# Patient Record
Sex: Female | Born: 1955 | Race: White | Hispanic: No | Marital: Married | Smoking: Never smoker
Health system: Southern US, Community
[De-identification: ages and names within clinical notes are randomized; demographics above are authoritative.]

## PROBLEM LIST (undated history)

## (undated) DIAGNOSIS — I10 Essential (primary) hypertension: Secondary | ICD-10-CM

## (undated) DIAGNOSIS — M810 Age-related osteoporosis without current pathological fracture: Secondary | ICD-10-CM

## (undated) DIAGNOSIS — J309 Allergic rhinitis, unspecified: Secondary | ICD-10-CM

## (undated) DIAGNOSIS — J45909 Unspecified asthma, uncomplicated: Secondary | ICD-10-CM

## (undated) HISTORY — DX: Allergic rhinitis, unspecified: J30.9

## (undated) HISTORY — DX: Age-related osteoporosis without current pathological fracture: M81.0

## (undated) HISTORY — DX: Essential (primary) hypertension: I10

## (undated) HISTORY — PX: UTERINE FIBROID SURGERY: SHX826

## (undated) HISTORY — DX: Unspecified asthma, uncomplicated: J45.909

## (undated) HISTORY — PX: APPENDECTOMY: SHX54

## (undated) HISTORY — PX: TUBAL LIGATION: SHX77

---

## 2003-01-28 ENCOUNTER — Ambulatory Visit (HOSPITAL_BASED_OUTPATIENT_CLINIC_OR_DEPARTMENT_OTHER): Admission: RE | Admit: 2003-01-28 | Discharge: 2003-01-28 | Payer: Self-pay | Admitting: Orthopedic Surgery

## 2005-03-31 ENCOUNTER — Encounter: Admission: RE | Admit: 2005-03-31 | Discharge: 2005-03-31 | Payer: Self-pay | Admitting: Family Medicine

## 2006-06-12 IMAGING — US US ABDOMEN COMPLETE
1 series · 14 of 25 positions shown · non-contrast
Comparison: None.

CLINICAL DATA: Elevated liver function tests. 
 ABDOMEN ULTRASOUND:
TECHNIQUE: Complete abdominal ultrasound examination was performed including evaluation of the liver, gallbladder, bile ducts, pancreas, kidneys, spleen, IVC, and abdominal aorta.

[Series 1: unknown · 14 of 77 slices shown]
[im 1/77]
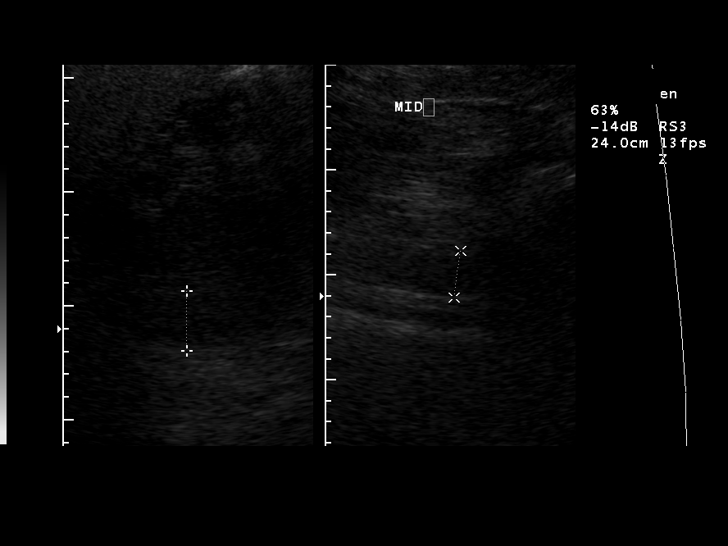
[im 7/77]
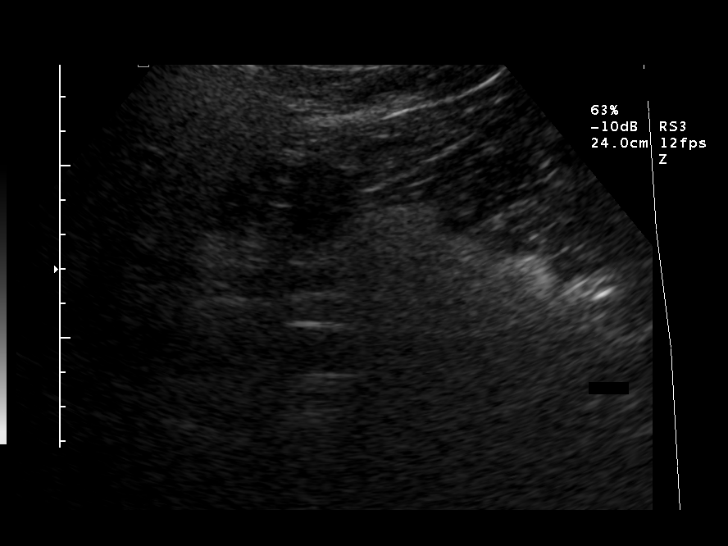
[im 13/77]
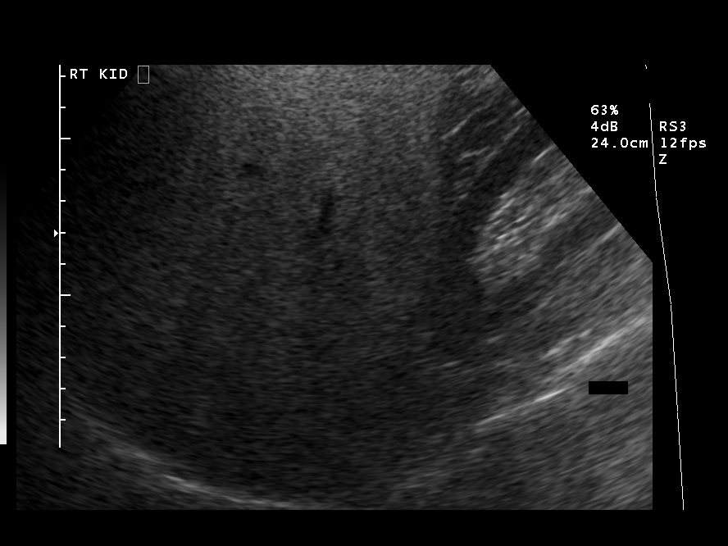
[im 20/77]
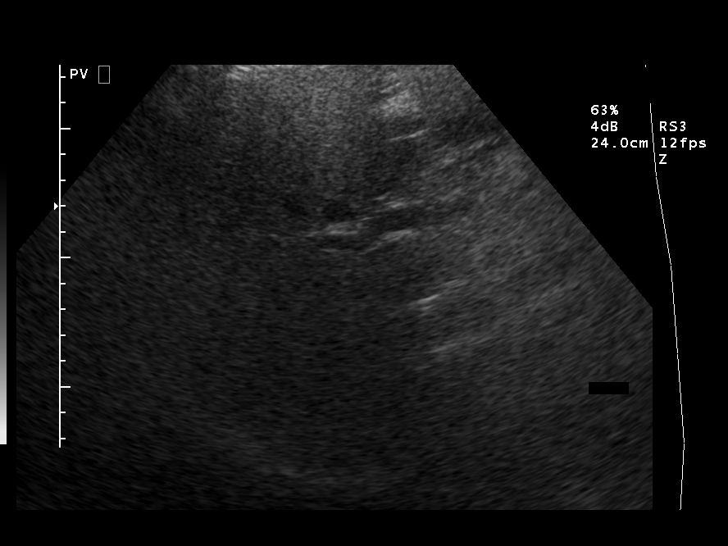
[im 26/77]
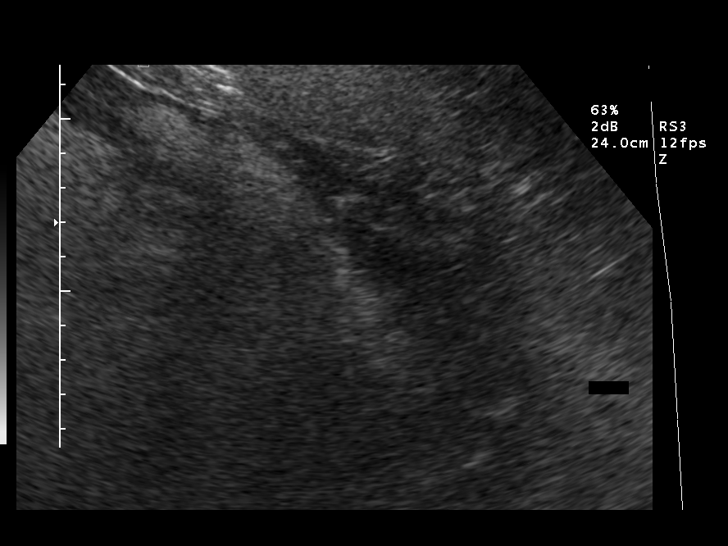
[im 29/77]
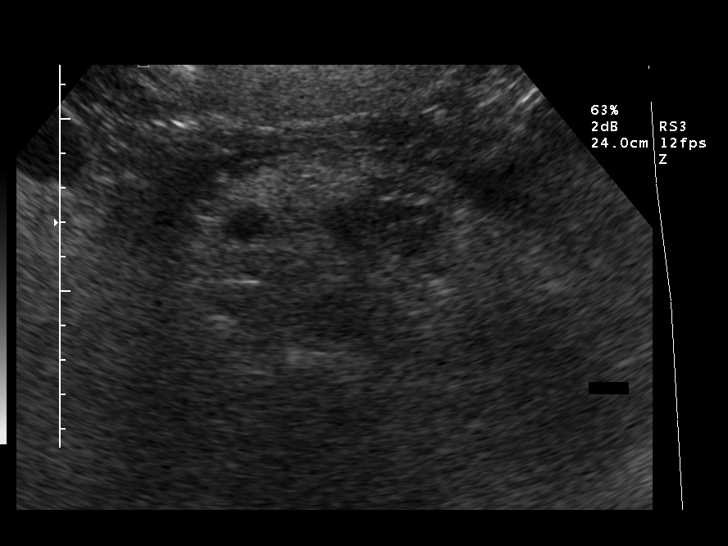
[im 35/77]
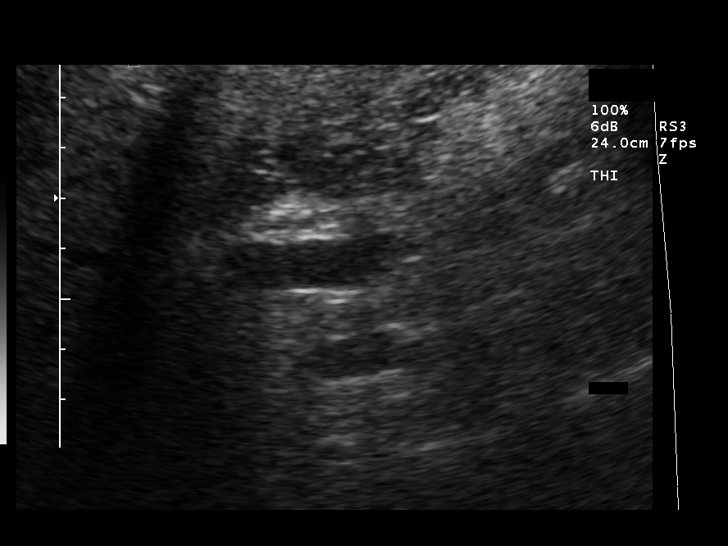
[im 42/77]
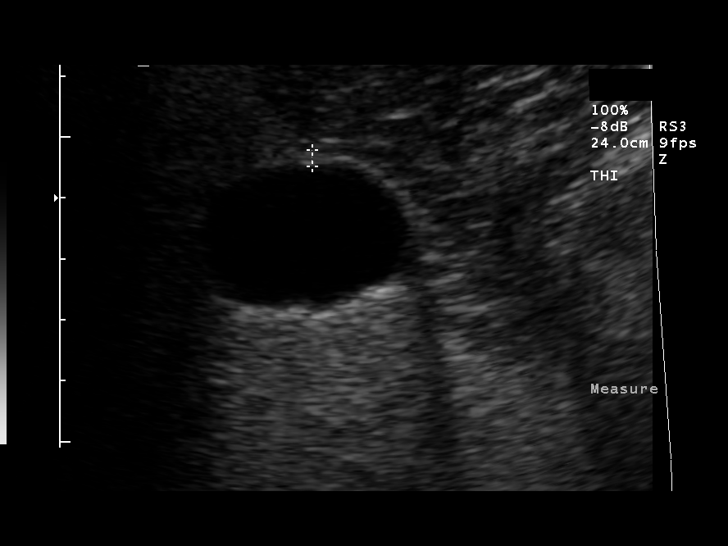
[im 48/77]
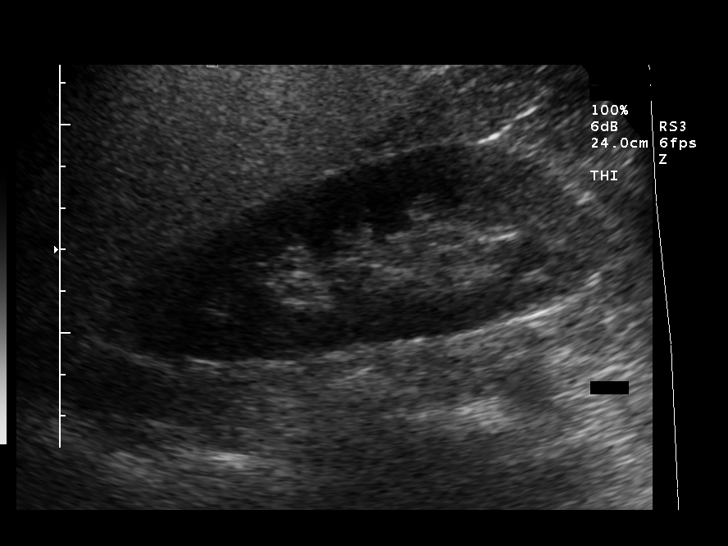
[im 51/77]
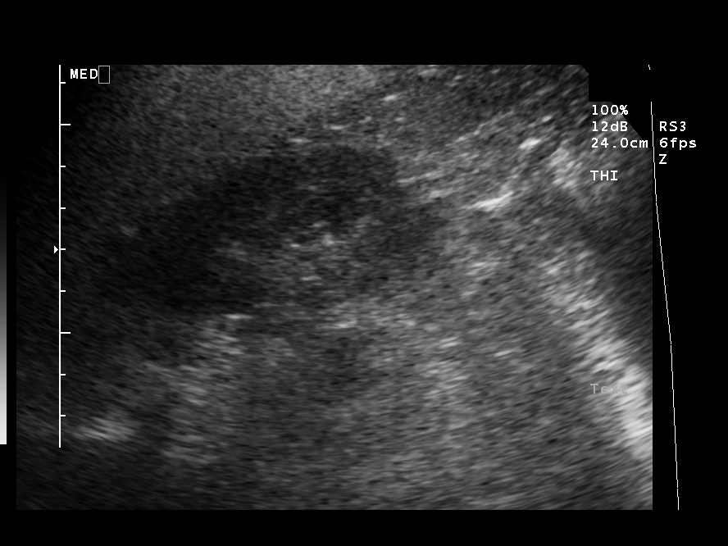
[im 58/77]
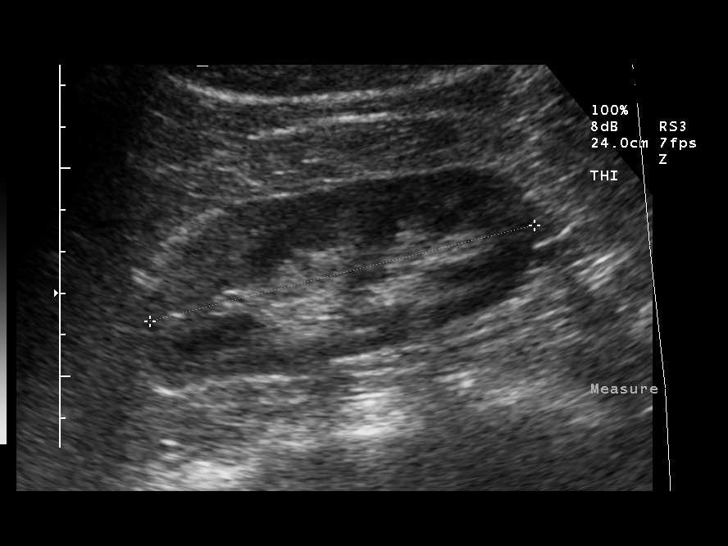
[im 64/77]
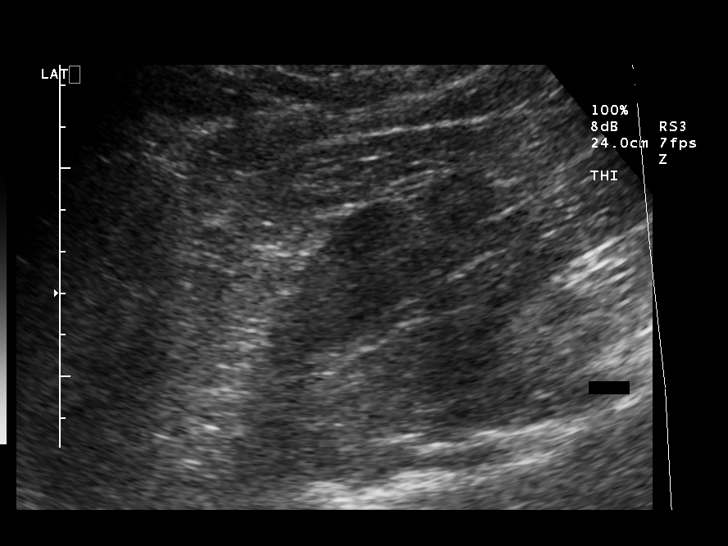
[im 70/77]
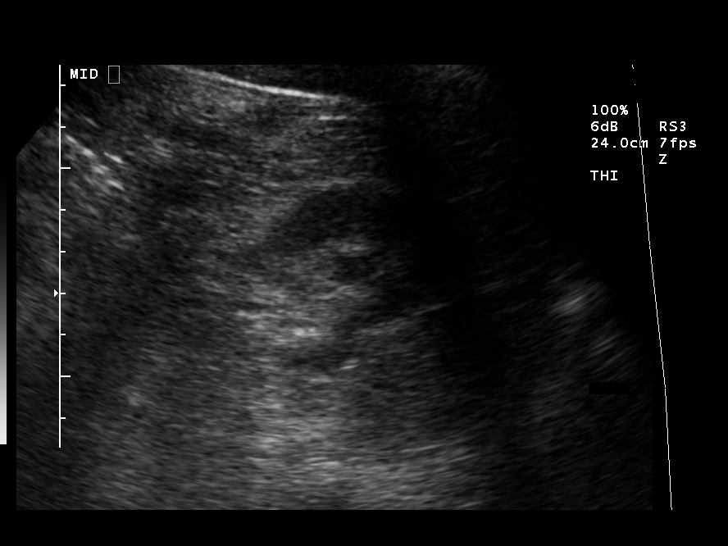
[im 77/77]
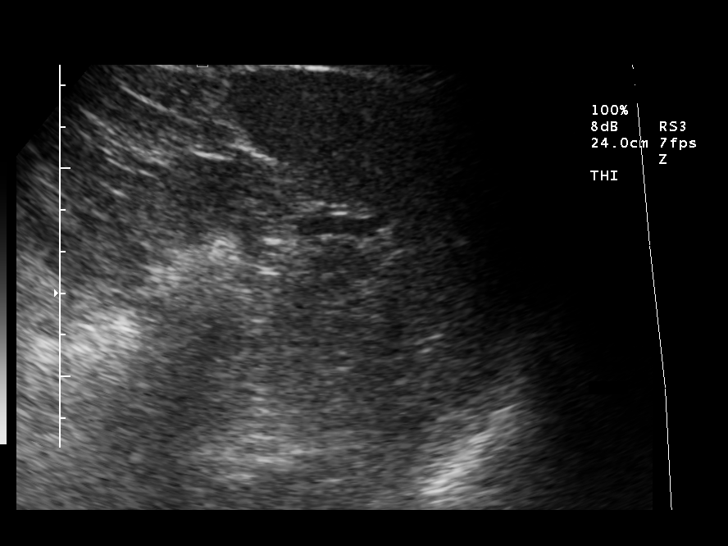

[14 of 25 positions shown; findings below may reference images not displayed]

FINDINGS: Study is limited by intestinal bowel gas.  Portions of the inferior vena cava and pancreas are obscured by bowel gas.  The liver demonstrates diffusely increased echogenicity most compatible with fatty infiltration.  No focal hepatic abnormalities are seen.  There is no biliary dilatation.  The gallbladder appears normal without gallstones or wall thickening.  Views of the spleen, pancreas, IVC, and aorta are normal.  The right kidney measures 11.6 cm in length and the left kidney 10.1 cm.  No focal renal abnormalities are seen.  Left renal cortex is slightly lobular.
IMPRESSION: 1.  Probable fatty infiltration of the liver.  No biliary dilatation.  
 2.  No acute abdominal findings are seen.  Portions of the pancreas are obscured by bowel gas.

## 2007-01-21 ENCOUNTER — Encounter: Admission: RE | Admit: 2007-01-21 | Discharge: 2007-01-21 | Payer: Self-pay | Admitting: Internal Medicine

## 2008-04-03 IMAGING — US US ABDOMEN COMPLETE
1 series · 14 of 25 positions shown · non-contrast
Comparison: 03/31/05.

CLINICAL DATA: Elevated LFTs.  
 ABDOMINAL ULTRASOUND:
TECHNIQUE: Complete abdominal ultrasound examination was performed including evaluation of the liver, gallbladder, bile ducts, pancreas, kidneys, spleen, IVC, and abdominal aorta.

[Series 1: us abdomen complete · 0.31mm/px · 14 of 77 slices shown]
[im 1/77]
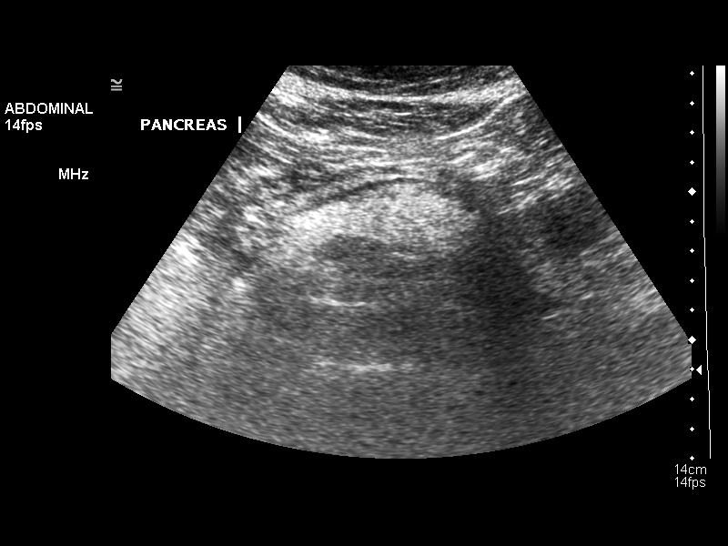
[im 7/77]
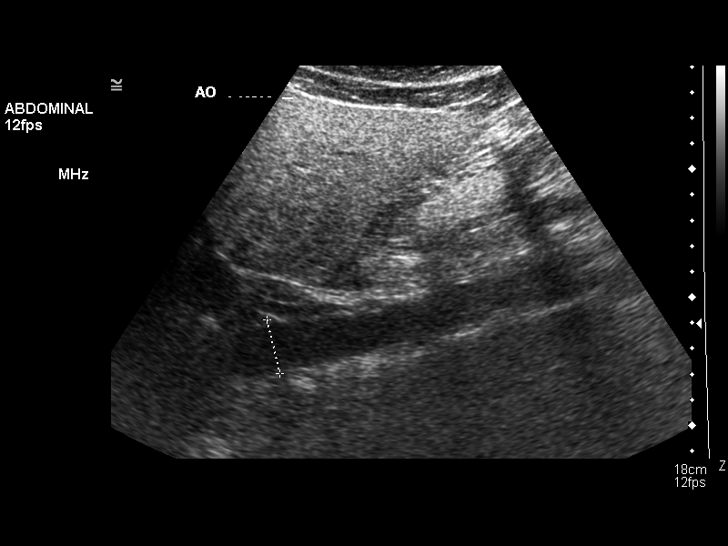
[im 13/77]
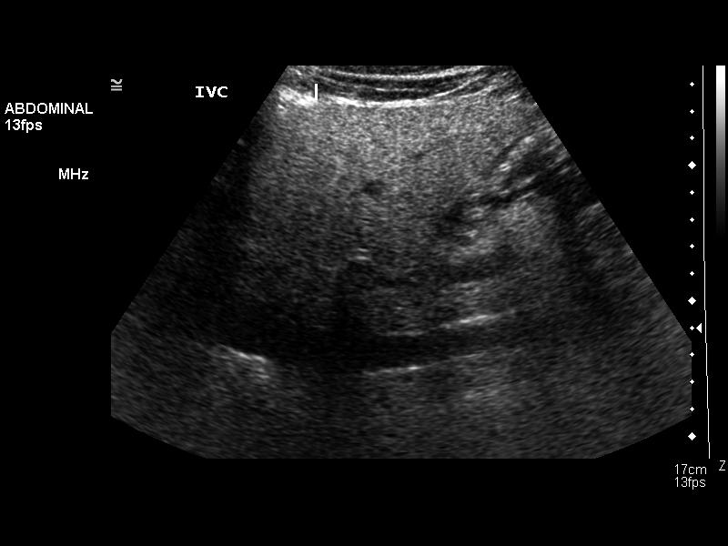
[im 20/77]
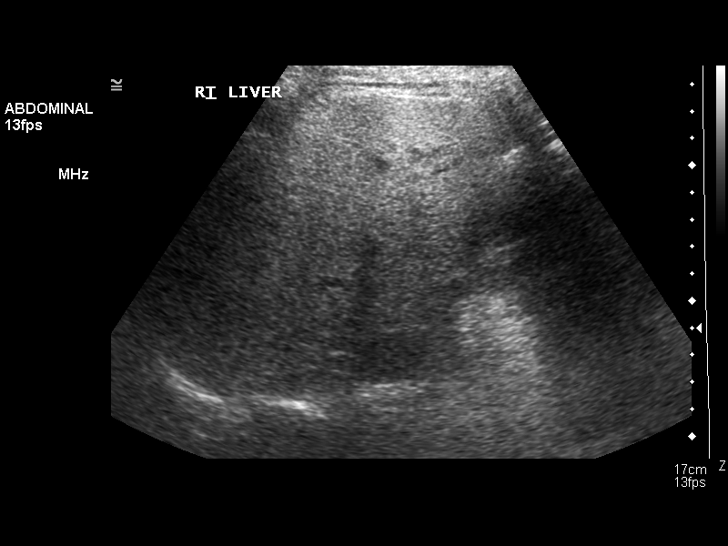
[im 26/77]
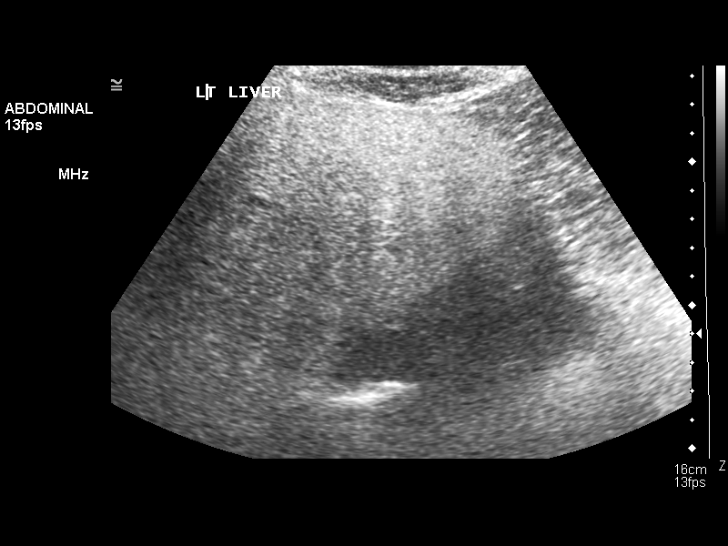
[im 29/77]
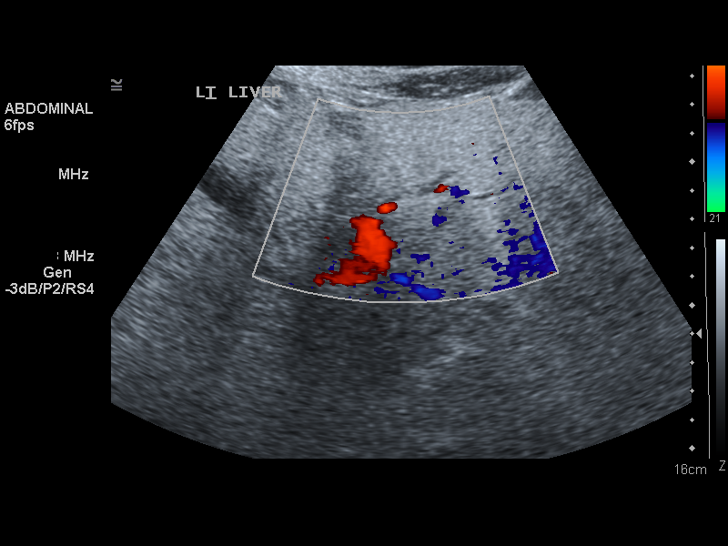
[im 35/77]
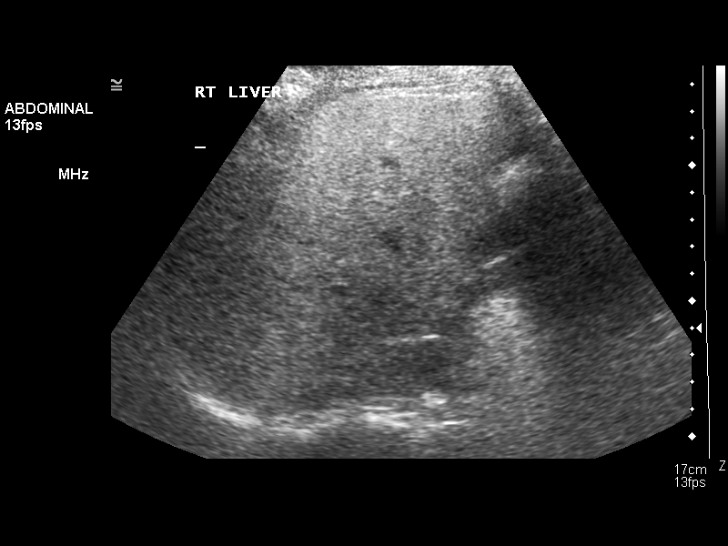
[im 42/77]
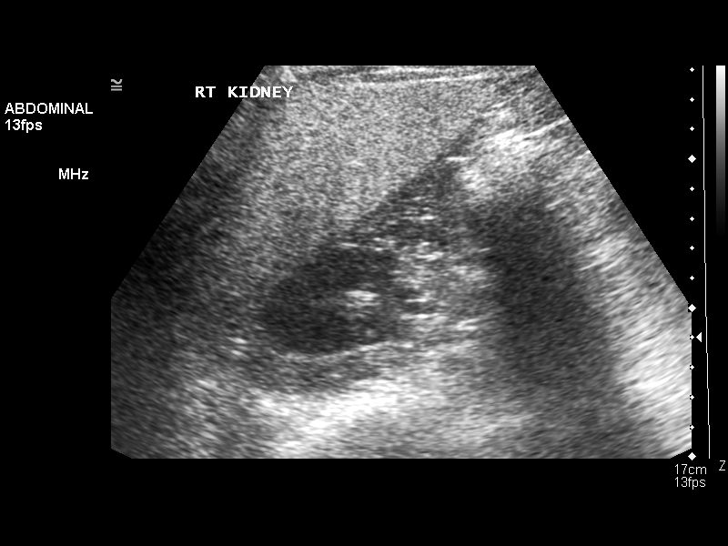
[im 48/77]
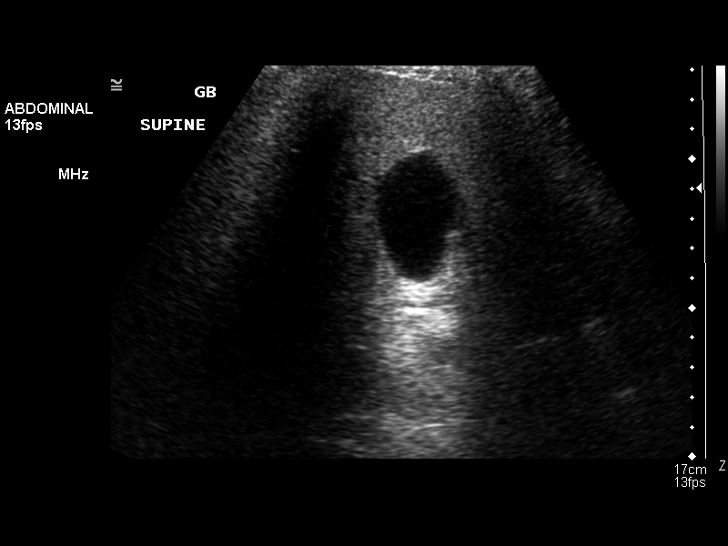
[im 51/77]
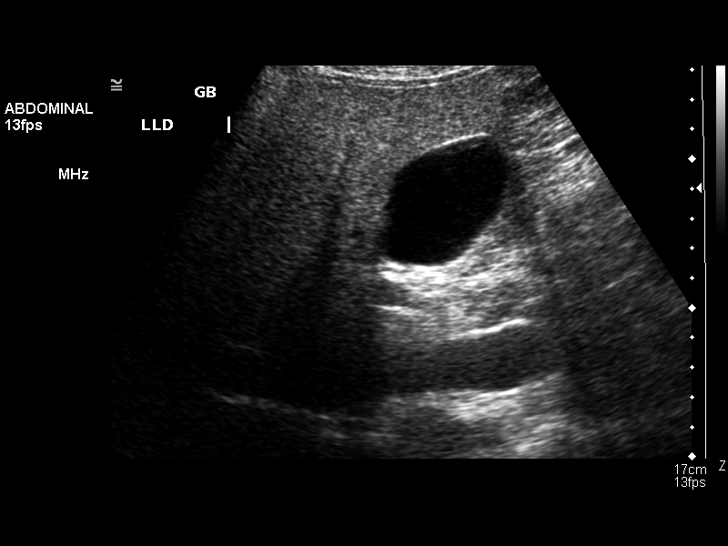
[im 58/77]
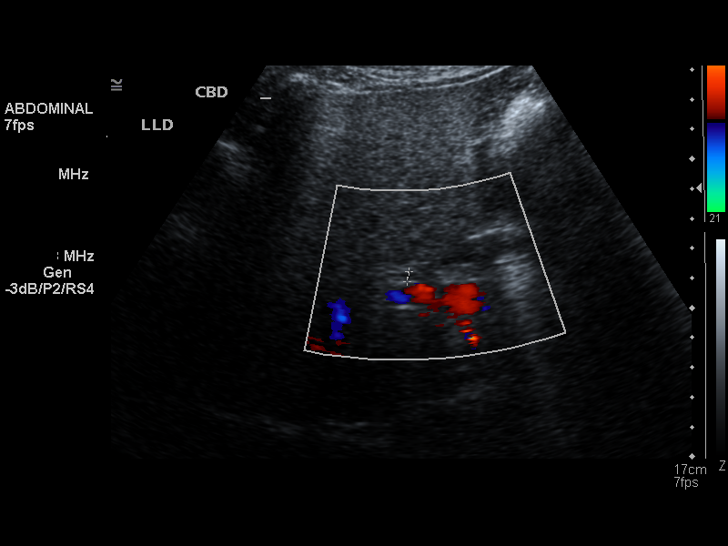
[im 64/77]
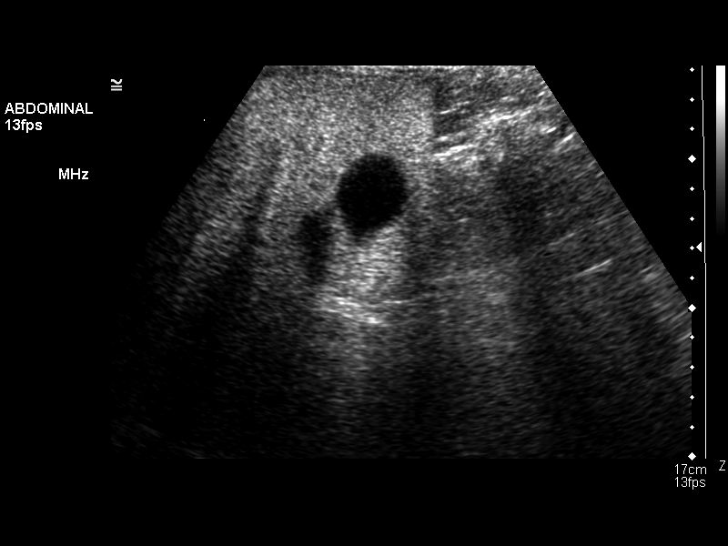
[im 70/77]
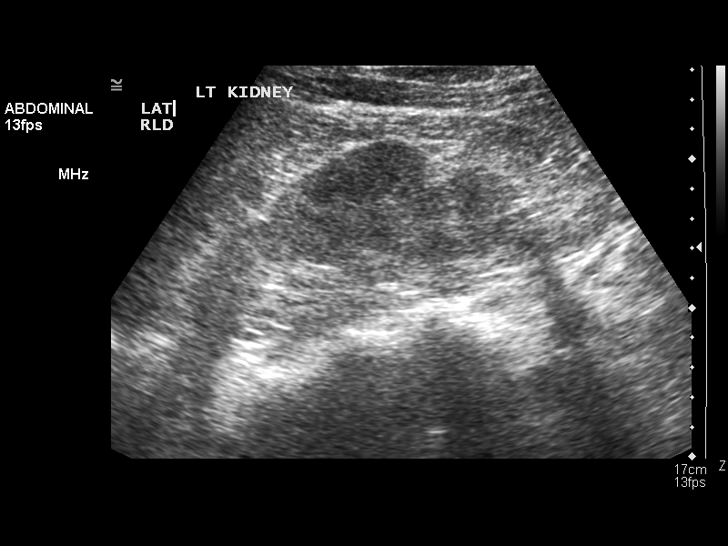
[im 77/77]
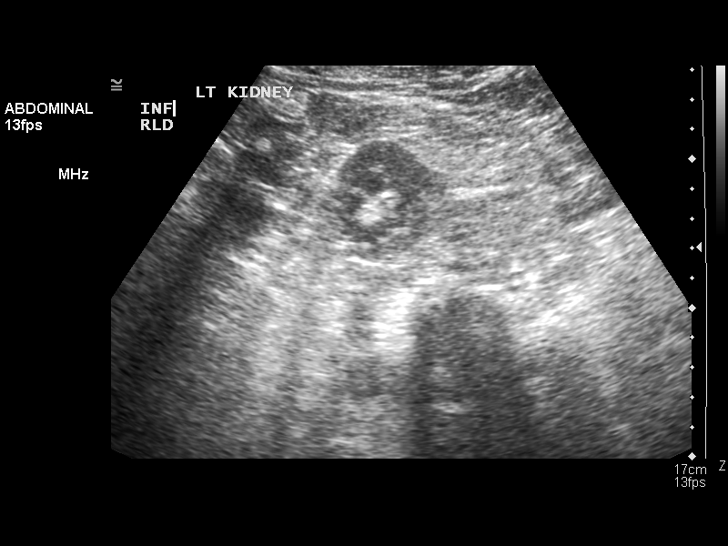

[14 of 25 positions shown; findings below may reference images not displayed]

FINDINGS: Liver is 18.1cm and increased in echogenicity diffusely.  Gallbladder, extrahepatic bile duct, pancreas unremarkable.  Spleen is at the upper limits of normal in size, at 11.4cm.  kidneys and aorta unremarkable.
IMPRESSION: Mildly enlarged fatty liver.

## 2010-11-18 NOTE — Op Note (Signed)
NAME:  Susan Lyons, Susan Lyons NO.:  [id number]   MEDICAL RECORD NO.:  [id number]                   PATIENT TYPE:  AMB   LOCATION:  [facility]                                  FACILITY:  [facility]   PHYSICIAN:  Harvie Junior, M.D.                DATE OF BIRTH:  [birth year]   DATE OF PROCEDURE:  01/28/2003  DATE OF DISCHARGE:                                 OPERATIVE REPORT   PREOPERATIVE DIAGNOSIS:  Medial meniscal tear versus medial femoral condylar  defect.   POSTOPERATIVE DIAGNOSIS:  Chondromalacia of medial femoral condyle.   OPERATION PERFORMED:  1. Chondroplasty of medial femoral condyle.  2. Removal of multiple ostial cartilaginous loose bodies.   SURGEON:  Harvie Junior, M.D.   ASSISTANT:  Marshia Ly, P.A.   ANESTHESIA:  General.   INDICATIONS FOR PROCEDURE:  [name] is a 55 year old female with a long  history of having medial side knee pain that has been getting progressively  worse.  We injected her with some relief, but symptoms recurred.  Because of  this, she was taken to the operating room for operative knee arthroscopy.   DESCRIPTION OF PROCEDURE:  The patient was taken to the operating room and  after adequate anesthesia was obtained with general anesthetic, the patient  was placed supine on the operating table.  The left knee was prepped and  draped in the usual sterile fashion. Following this, routine arthroscopic  examination of the knee revealed that there was obvious chondromalacia of  the medial femoral condyle.  This was debrided with a suction shaver back to  a smooth and stable rim with some grade 2 and early grade 3 changes.  Attention was turned to the anterior cruciate which was normal, lateral side  which was normal.  The lateral medial meniscus was probed.  Anterior  cruciate was evaluated under stretch.  Attention was turned to the  patellofemoral joint where there was no significant patellofemoral  abnormality.  There  was a fairly significant cartilaginous loose bodies in  the superior patellar pouch that we had to debride significantly to remove  these.  Also throughout the lateral area and medial area there were  significant loose bodies.  These were removed during the case.  Following  this, the knee was copiously irrigated and suctioned dry.  The arthroscopic  portals were closed with a bandage.  Sterile compressive dressing applied.  The patient was taken to the recovery room where she was noted to be in  satisfactory condition.  The estimated blood loss for this procedure was  none.                                                Harvie Junior, M.D.    Ranae Plumber  D:  01/28/2003  T:  01/28/2003  Job:  [id number]

## 2015-02-03 DIAGNOSIS — I10 Essential (primary) hypertension: Secondary | ICD-10-CM | POA: Insufficient documentation

## 2015-02-20 DIAGNOSIS — J309 Allergic rhinitis, unspecified: Secondary | ICD-10-CM

## 2015-02-20 DIAGNOSIS — J3089 Other allergic rhinitis: Secondary | ICD-10-CM | POA: Insufficient documentation

## 2015-02-20 DIAGNOSIS — J302 Other seasonal allergic rhinitis: Secondary | ICD-10-CM | POA: Insufficient documentation

## 2015-02-20 DIAGNOSIS — J4550 Severe persistent asthma, uncomplicated: Secondary | ICD-10-CM

## 2015-02-20 DIAGNOSIS — J4551 Severe persistent asthma with (acute) exacerbation: Secondary | ICD-10-CM | POA: Insufficient documentation

## 2015-03-12 ENCOUNTER — Other Ambulatory Visit: Payer: Self-pay | Admitting: *Deleted

## 2015-03-12 MED ORDER — OMALIZUMAB 150 MG ~~LOC~~ SOLR: 300.0000 mg | SUBCUTANEOUS | Status: DC

## 2015-04-14 ENCOUNTER — Ambulatory Visit (INDEPENDENT_AMBULATORY_CARE_PROVIDER_SITE_OTHER): Payer: BLUE CROSS/BLUE SHIELD | Admitting: Internal Medicine

## 2015-04-14 ENCOUNTER — Encounter: Payer: Self-pay | Admitting: Internal Medicine

## 2015-04-14 VITALS — BP 112/60 | HR 72 | Temp 98.4°F | Resp 16

## 2015-04-14 DIAGNOSIS — J4550 Severe persistent asthma, uncomplicated: Secondary | ICD-10-CM | POA: Diagnosis not present

## 2015-04-14 DIAGNOSIS — J302 Other seasonal allergic rhinitis: Secondary | ICD-10-CM

## 2015-04-14 NOTE — Assessment & Plan Note (Signed)
   For nasal drainage, start nasal saline rinse twice a day prior to nasal sprays.  Continue dymista and loratadine.

## 2015-04-14 NOTE — Progress Notes (Signed)
04/14/2015  Susan Lyons 07/28/1955 [id number]  Referring provider: No referring provider defined for this encounter.  Chief Complaint: Follow-up and Cough   Susan Lyons is a 59 y.o. female who is being seen today for followup.   HPI Comments: Persistent asthma on Xolair: Symptoms have been stable since her last visit. With the onset of all, she has had return of a mild cough. She has not had any reactions Xolair since her last visit.  Allergic rhinitis: Skin testing at last visit was positive for mold. Vancocin control.    ROS: Per HPI unless specifically indicated below Review of Systems   Drug Allergies:  Allergies  Allergen Reactions  . Avelox [Moxifloxacin Hcl In Nacl] Nausea Only  . Spiriva Handihaler [Tiotropium Bromide Monohydrate] Swelling  . Sulfa Antibiotics Rash     Physical Exam: BP 112/60 mmHg  Pulse 72  Temp(Src) 98.4 F (36.9 C)  Resp 16  Physical Exam  Constitutional: She appears well-developed and well-nourished. No distress.  HENT:  Right Ear: External ear normal.  Left Ear: External ear normal.  Nose: Nose normal.  Mouth/Throat: Oropharynx is clear and moist.  Eyes: Conjunctivae are normal. Right eye exhibits no discharge. Left eye exhibits no discharge.  Cardiovascular: Normal rate, regular rhythm and normal heart sounds.   No murmur heard. Pulmonary/Chest: Effort normal and breath sounds normal. No respiratory distress. She has no wheezes. She has no rales.  Abdominal: Soft. Bowel sounds are normal.  Musculoskeletal: She exhibits no edema.  Lymphadenopathy:    She has no cervical adenopathy.  Neurological: She is alert.  Skin: No rash noted.  Vitals reviewed.     Diagnostics:   Spirometry: FEV1 77 %, FEV1/FVC  79%   Spirometry is in the normal range.   Assessment and Plan:  Asthma, chronic  Persistent, currently well controlled.  She will use Qvar as burst therapy.  Continue daily QVAR, montelukast and as needed  albuterol.  Continue Xolair. Has EpiPen and action plan.  Allergic rhinitis  For nasal drainage, start nasal saline rinse twice a day prior to nasal sprays.  Continue dymista and loratadine.   Return in about 6 months (around 10/13/2015).  Thank you for the opportunity to care for this patient.  Please do not hesitate to contact me with questions.  [facility] [address] [place], [place] [address] [phone]

## 2015-04-14 NOTE — Assessment & Plan Note (Signed)
   Persistent, currently well controlled.  She will use Qvar as burst therapy.  Continue daily QVAR, montelukast and as needed albuterol.  Continue Xolair. Has EpiPen and action plan.

## 2015-04-14 NOTE — Patient Instructions (Signed)
Asthma, chronic  Persistent, currently well controlled.  She will use Qvar as burst therapy.  Continue daily QVAR, montelukast and as needed albuterol.  Continue Xolair. Has EpiPen and action plan.  Allergic rhinitis  For nasal drainage, start nasal saline rinse twice a day prior to nasal sprays.  Continue dymista and loratadine.

## 2015-04-19 ENCOUNTER — Ambulatory Visit (INDEPENDENT_AMBULATORY_CARE_PROVIDER_SITE_OTHER): Payer: BLUE CROSS/BLUE SHIELD

## 2015-04-19 DIAGNOSIS — J4540 Moderate persistent asthma, uncomplicated: Secondary | ICD-10-CM | POA: Diagnosis not present

## 2015-04-19 MED ORDER — OMALIZUMAB 150 MG ~~LOC~~ SOLR
300.0000 mg | SUBCUTANEOUS | Status: AC
  Administered 2015-04-19 – 2024-06-16 (×112): 300 mg via SUBCUTANEOUS

## 2015-05-17 ENCOUNTER — Ambulatory Visit (INDEPENDENT_AMBULATORY_CARE_PROVIDER_SITE_OTHER): Payer: BLUE CROSS/BLUE SHIELD | Admitting: *Deleted

## 2015-05-17 DIAGNOSIS — J4540 Moderate persistent asthma, uncomplicated: Secondary | ICD-10-CM | POA: Diagnosis not present

## 2015-06-14 ENCOUNTER — Ambulatory Visit (INDEPENDENT_AMBULATORY_CARE_PROVIDER_SITE_OTHER): Payer: BLUE CROSS/BLUE SHIELD

## 2015-06-14 DIAGNOSIS — J4540 Moderate persistent asthma, uncomplicated: Secondary | ICD-10-CM | POA: Diagnosis not present

## 2015-06-30 ENCOUNTER — Telehealth: Payer: Self-pay | Admitting: Internal Medicine

## 2015-06-30 ENCOUNTER — Other Ambulatory Visit: Payer: Self-pay | Admitting: Allergy

## 2015-06-30 ENCOUNTER — Telehealth: Payer: Self-pay | Admitting: Allergy

## 2015-06-30 MED ORDER — FLUTICASONE PROPIONATE 50 MCG/ACT NA SUSP: 1.0000 | Freq: Two times a day (BID) | NASAL | Status: DC

## 2015-06-30 NOTE — Telephone Encounter (Signed)
Her insurance sent her a letter saying that they will no longer cover her Dymista. She wants to know if she can be given something that her insurance will cover.  She uses [facility] off [address]

## 2015-06-30 NOTE — Telephone Encounter (Signed)
CALLED PATIENT AND INFORMED HER WE HAD CALLED IN [facility] FOR HER.

## 2015-07-20 ENCOUNTER — Telehealth: Payer: Self-pay

## 2015-07-20 ENCOUNTER — Telehealth: Payer: Self-pay | Admitting: Internal Medicine

## 2015-07-20 NOTE — Telephone Encounter (Signed)
She says that she wants to continue with the [facility]. Her insurance company says that no matter what medication she is given it will be a $200 copay.

## 2015-07-20 NOTE — Telephone Encounter (Signed)
Pt states that her insurance has changed its formulary and Elwin Sleight is now going to cost her $200.00. Would like an alternative med that is more cost effective.

## 2015-07-21 ENCOUNTER — Ambulatory Visit (INDEPENDENT_AMBULATORY_CARE_PROVIDER_SITE_OTHER): Payer: BLUE CROSS/BLUE SHIELD | Admitting: *Deleted

## 2015-07-21 ENCOUNTER — Other Ambulatory Visit: Payer: Self-pay | Admitting: Allergy

## 2015-07-21 DIAGNOSIS — J4540 Moderate persistent asthma, uncomplicated: Secondary | ICD-10-CM | POA: Diagnosis not present

## 2015-07-21 MED ORDER — MOMETASONE FURO-FORMOTEROL FUM 200-5 MCG/ACT IN AERO: 2.0000 | INHALATION_SPRAY | Freq: Two times a day (BID) | RESPIRATORY_TRACT | Status: DC

## 2015-07-21 MED ORDER — MONTELUKAST SODIUM 10 MG PO TABS: 10.0000 mg | ORAL_TABLET | Freq: Every day | ORAL | Status: DC

## 2015-07-21 NOTE — Telephone Encounter (Signed)
Could switch to Breo Ellipta 200 g 1 inhalation daily

## 2015-07-21 NOTE — Telephone Encounter (Signed)
Patient called and said to go ahead and refill the dulera and montuelukast. Faxed rx. In.

## 2015-08-18 ENCOUNTER — Ambulatory Visit (INDEPENDENT_AMBULATORY_CARE_PROVIDER_SITE_OTHER): Payer: BLUE CROSS/BLUE SHIELD

## 2015-08-18 DIAGNOSIS — J4540 Moderate persistent asthma, uncomplicated: Secondary | ICD-10-CM

## 2015-09-15 ENCOUNTER — Ambulatory Visit (INDEPENDENT_AMBULATORY_CARE_PROVIDER_SITE_OTHER): Payer: BLUE CROSS/BLUE SHIELD

## 2015-09-15 DIAGNOSIS — J4540 Moderate persistent asthma, uncomplicated: Secondary | ICD-10-CM

## 2015-09-16 ENCOUNTER — Other Ambulatory Visit: Payer: Self-pay | Admitting: Allergy

## 2015-09-16 MED ORDER — MOMETASONE FURO-FORMOTEROL FUM 200-5 MCG/ACT IN AERO: 2.0000 | INHALATION_SPRAY | Freq: Two times a day (BID) | RESPIRATORY_TRACT | Status: DC

## 2015-10-13 ENCOUNTER — Ambulatory Visit (INDEPENDENT_AMBULATORY_CARE_PROVIDER_SITE_OTHER): Payer: BLUE CROSS/BLUE SHIELD | Admitting: Internal Medicine

## 2015-10-13 ENCOUNTER — Encounter: Payer: Self-pay | Admitting: Internal Medicine

## 2015-10-13 ENCOUNTER — Ambulatory Visit (INDEPENDENT_AMBULATORY_CARE_PROVIDER_SITE_OTHER): Payer: BLUE CROSS/BLUE SHIELD

## 2015-10-13 VITALS — BP 120/82 | HR 60 | Temp 98.0°F | Resp 16 | Ht 59.84 in | Wt 154.3 lb

## 2015-10-13 DIAGNOSIS — J4550 Severe persistent asthma, uncomplicated: Secondary | ICD-10-CM

## 2015-10-13 DIAGNOSIS — J4540 Moderate persistent asthma, uncomplicated: Secondary | ICD-10-CM

## 2015-10-13 DIAGNOSIS — J3089 Other allergic rhinitis: Secondary | ICD-10-CM

## 2015-10-13 MED ORDER — EPINEPHRINE 0.3 MG/0.3ML IJ SOAJ: INTRAMUSCULAR | Status: DC

## 2015-10-13 MED ORDER — IPRATROPIUM-ALBUTEROL 18-103 MCG/ACT IN AERO: INHALATION_SPRAY | RESPIRATORY_TRACT | Status: DC

## 2015-10-13 NOTE — Assessment & Plan Note (Signed)
   Continue fluticasone 2 sprays each nostril daily. Stop using it on the right side for 3 days to see if pain improves  Continue loratadine 10 mg daily  During the spring, rinse nostrils with nasal saline at bedtime prior to nasal spray

## 2015-10-13 NOTE — Patient Instructions (Signed)
Allergic rhinitis  Continue fluticasone 2 sprays each nostril daily. Stop using it on the right side for 3 days to see if pain improves  Continue loratadine 10 mg daily  During the spring, rinse nostrils with nasal saline at bedtime prior to nasal spray  Asthma, chronic  Persistent, currently well controlled.  She will use Qvar as burst therapy.  Continue Dulera 200 mg 2 puffs twice a day, montelukast and as needed Combivent.  Continue Xolair. Has EpiPen and action plan.

## 2015-10-13 NOTE — Progress Notes (Signed)
History of Present Illness: Susan Lyons is a 60 y.o. female presenting for follow-up  HPI Comments: Persistent asthma on Xolair: Symptoms have been stable since her last visit. She has had mild chest tightness and wheezing with the onset of spring. She i, Singulair 10 mg daily. She has not required albuterol excessively s doing well on Dulera 200 mg 2 puffs twice a day, Qvar 80 g which she adds as burst therapy  Allergic rhinitis: Skin testing at last visit was positive for mold. She has been having pain with palpation of the right side of her nostril.  No interval sinus infections.    Current Outpatient Prescriptions on File Prior to Visit  Medication Sig Dispense Refill  . beclomethasone (QVAR) 80 MCG/ACT inhaler Inhale 1 puff into the lungs 2 (two) times daily. Use as burst therapy.    . calcium carbonate (OSCAL) 1500 (600 CA) MG TABS tablet Take 1 tablet by mouth daily at 8 pm.    . diphenhydrAMINE (SOMINEX) 25 MG tablet Take 50 mg by mouth as needed for sleep. Reported on 10/13/2015    . fluticasone (FLONASE) 50 MCG/ACT nasal spray Place 1 spray into both nostrils 2 (two) times daily. 16 g 5  . lisinopril (PRINIVIL,ZESTRIL) 10 MG tablet Take 10 mg by mouth daily. Reported on 10/13/2015    . loratadine (CLARITIN) 10 MG tablet Take 10 mg by mouth daily.    . mometasone-formoterol (DULERA) 200-5 MCG/ACT AERO Inhale 2 puffs into the lungs 2 (two) times daily. 1 Inhaler 4  . montelukast (SINGULAIR) 10 MG tablet Take 1 tablet (10 mg total) by mouth at bedtime. 30 tablet 3  . omeprazole (PRILOSEC OTC) 20 MG tablet Take 40 mg by mouth daily.     Current Facility-Administered Medications on File Prior to Visit  Medication Dose Route Frequency Provider Last Rate Last Dose  . omalizumab Geoffry Paradise) injection 300 mg  300 mg Subcutaneous Q28 days Jessica Priest, MD   300 mg at 10/13/15 1006    Assessment and Plan: Allergic rhinitis  Continue fluticasone 2 sprays each nostril daily. Stop using it  on the right side for 3 days to see if pain improves  Continue loratadine 10 mg daily  During the spring, rinse nostrils with nasal saline at bedtime prior to nasal spray  Asthma, chronic  Persistent, currently well controlled.  She will use Qvar as burst therapy.  Continue Dulera 200 mg 2 puffs twice a day, montelukast and as needed Combivent.  Continue Xolair. Has EpiPen and action plan.     Return in about 6 months (around 04/13/2016).  Meds ordered this encounter  Medications  . lisinopril-hydrochlorothiazide (PRINZIDE,ZESTORETIC) 10-12.5 MG tablet    Sig: Take by mouth.  . DISCONTD: omalizumab (XOLAIR) 150 MG injection    Sig: Inject 1 mg into the skin.  Marland Kitchen DISCONTD: omeprazole (PRILOSEC) 20 MG capsule    Sig: Take 20 mg by mouth.  . ALBUTEROL SULFATE HFA IN    Sig: Inhale into the lungs.  Marland Kitchen DISCONTD: Calcium Carbonate-Vitamin D 600-400 MG-UNIT tablet    Sig: Take by mouth.  . DISCONTD: albuterol-ipratropium (COMBIVENT) 18-103 MCG/ACT inhaler    Sig:   . Misc Natural Products (OSTEO BI-FLEX JOINT SHIELD) TABS    Sig: Take by mouth.  Marland Kitchen aspirin 81 MG tablet    Sig: Take 81 mg by mouth daily.  Marland Kitchen EPINEPHrine (EPIPEN 2-PAK) 0.3 mg/0.3 mL IJ SOAJ injection    Sig: USE AS DIRECTED FOR SEVERE ALLERGIC REACTION  Dispense:  1 Device    Refill:  2    DISPENSE MYLAN BRAND OR MYLAN GENERIC EQUIVALENT ONLY.  Marland Kitchen. albuterol-ipratropium (COMBIVENT) 18-103 MCG/ACT inhaler    Sig: ONE PUFF FOUR TIMES A DAY    Dispense:  14.7 g    Refill:  3    Diagnostics: Spirometry: FEV1 1.72 L or 80 %, FEV1/FVC  84 %.  This is consistent with mild restriction. Full reversibility in the past.  Physical Exam: BP 120/82 mmHg  Pulse 60  Temp(Src) 98 F (36.7 C) (Oral)  Resp 16  Ht 4' 11.84" (1.52 m)  Wt 154 lb 5.2 oz (70 kg)  BMI 30.30 kg/m2   Physical Exam  Constitutional: She appears well-developed and well-nourished. No distress.  HENT:  Right Ear: External ear normal.  Left Ear:  External ear normal.  Mouth/Throat: Oropharynx is clear and moist.  Mild tenderness to palpation over the right side of her nose and face externally   Eyes: Conjunctivae are normal. Right eye exhibits no discharge. Left eye exhibits no discharge.  Cardiovascular: Normal rate, regular rhythm and normal heart sounds.   No murmur heard. Pulmonary/Chest: Effort normal and breath sounds normal. No respiratory distress. She has no wheezes. She has no rales.  Abdominal: Soft. Bowel sounds are normal.  Musculoskeletal: She exhibits no edema.  Lymphadenopathy:    She has no cervical adenopathy.  Neurological: She is alert.  Skin: No rash noted.  Vitals reviewed.   Drug Allergies:  Allergies  Allergen Reactions  . Avelox [Moxifloxacin Hcl In Nacl] Nausea Only  . Spiriva Handihaler [Tiotropium Bromide Monohydrate] Swelling  . Tiotropium Swelling  . Sulfa Antibiotics Rash    ROS: Per HPI unless specifically indicated below Review of Systems  Thank you for the opportunity to care for this patient.  Please do not hesitate to contact me with questions.

## 2015-10-13 NOTE — Assessment & Plan Note (Signed)
   Persistent, currently well controlled.  She will use Qvar as burst therapy.  Continue Dulera 200 mg 2 puffs twice a day, montelukast and as needed Combivent.  Continue Xolair. Has EpiPen and action plan.

## 2015-11-22 ENCOUNTER — Ambulatory Visit (INDEPENDENT_AMBULATORY_CARE_PROVIDER_SITE_OTHER): Payer: BLUE CROSS/BLUE SHIELD

## 2015-11-22 DIAGNOSIS — J4540 Moderate persistent asthma, uncomplicated: Secondary | ICD-10-CM | POA: Diagnosis not present

## 2015-12-27 ENCOUNTER — Ambulatory Visit (INDEPENDENT_AMBULATORY_CARE_PROVIDER_SITE_OTHER): Payer: BLUE CROSS/BLUE SHIELD

## 2015-12-27 ENCOUNTER — Other Ambulatory Visit: Payer: Self-pay | Admitting: *Deleted

## 2015-12-27 DIAGNOSIS — J4540 Moderate persistent asthma, uncomplicated: Secondary | ICD-10-CM

## 2015-12-27 MED ORDER — MONTELUKAST SODIUM 10 MG PO TABS: 10.0000 mg | ORAL_TABLET | Freq: Every day | ORAL | Status: DC

## 2015-12-28 ENCOUNTER — Other Ambulatory Visit: Payer: Self-pay | Admitting: Allergy

## 2015-12-29 ENCOUNTER — Other Ambulatory Visit: Payer: Self-pay | Admitting: Allergy

## 2016-01-24 ENCOUNTER — Ambulatory Visit (INDEPENDENT_AMBULATORY_CARE_PROVIDER_SITE_OTHER): Payer: BLUE CROSS/BLUE SHIELD

## 2016-01-24 DIAGNOSIS — J4540 Moderate persistent asthma, uncomplicated: Secondary | ICD-10-CM | POA: Diagnosis not present

## 2016-02-07 ENCOUNTER — Other Ambulatory Visit: Payer: Self-pay | Admitting: Allergy

## 2016-02-07 MED ORDER — MOMETASONE FURO-FORMOTEROL FUM 200-5 MCG/ACT IN AERO: 2.0000 | INHALATION_SPRAY | Freq: Two times a day (BID) | RESPIRATORY_TRACT | 4 refills | Status: DC

## 2016-02-07 MED ORDER — BECLOMETHASONE DIPROPIONATE 80 MCG/ACT IN AERS: INHALATION_SPRAY | RESPIRATORY_TRACT | 5 refills | Status: DC

## 2016-02-21 ENCOUNTER — Ambulatory Visit (INDEPENDENT_AMBULATORY_CARE_PROVIDER_SITE_OTHER): Payer: BLUE CROSS/BLUE SHIELD

## 2016-02-21 DIAGNOSIS — J4540 Moderate persistent asthma, uncomplicated: Secondary | ICD-10-CM

## 2016-03-20 ENCOUNTER — Ambulatory Visit: Payer: BLUE CROSS/BLUE SHIELD

## 2016-03-22 ENCOUNTER — Ambulatory Visit (INDEPENDENT_AMBULATORY_CARE_PROVIDER_SITE_OTHER): Payer: BLUE CROSS/BLUE SHIELD

## 2016-03-22 DIAGNOSIS — J4540 Moderate persistent asthma, uncomplicated: Secondary | ICD-10-CM | POA: Diagnosis not present

## 2016-04-13 ENCOUNTER — Encounter: Payer: Self-pay | Admitting: Allergy and Immunology

## 2016-04-13 ENCOUNTER — Ambulatory Visit (INDEPENDENT_AMBULATORY_CARE_PROVIDER_SITE_OTHER): Payer: BLUE CROSS/BLUE SHIELD | Admitting: Allergy and Immunology

## 2016-04-13 VITALS — BP 114/70 | HR 64 | Temp 98.4°F | Resp 20

## 2016-04-13 DIAGNOSIS — J309 Allergic rhinitis, unspecified: Secondary | ICD-10-CM | POA: Diagnosis not present

## 2016-04-13 DIAGNOSIS — J4550 Severe persistent asthma, uncomplicated: Secondary | ICD-10-CM | POA: Diagnosis not present

## 2016-04-13 NOTE — Progress Notes (Signed)
Follow-up Note  RE: Susan Lyons MRN: [id number] DOB: 12/11/1955 Date of Office Visit: 04/13/2016  Primary care provider: Pcp Not In System Referring provider: No ref. provider found  History of present illness: Susan Lyons is a [age] y.o. female persistent asthma on Xolair and allergic rhinitis presenting today for follow up.  She was last seen in this clinic on 10/13/2015 by Dr. Clydie BraunBhatti who has since left the practice.  She receives omalizumab injections every 4 weeks.  She has noticed a significant symptom reduction while on Xolair.  She currently takes Dulera 200 g, 2 inhalations twice a day, montelukast 10 mg daily bedtime, and Qvar 80 g during asthma flares.  She is not using a spacer device [facility].  She believes that she is experiencing emotional lability and irritability from the montelukast.  She has no nasal symptom complaints today.   Assessment and plan: Severe persistent asthma  Due to perceived side effects, discontinue montelukast.  Continue Xolair injections every 4 weeks and Dulera 200/5 g, 2 inhalations twice a day. To maximize pulmonary deposition, a spacer has been provided along with instructions for its proper administration with an HFA inhaler.  During respiratory tract infections or asthma flares, add Qvar 80 g   to 2 inhalations 2 times per day until symptoms have returned to baseline.  Subjective and objective measures of pulmonary function will be followed and the treatment plan will be adjusted accordingly.  Allergic rhinitis  Continue appropriate allergen avoidance measures, fluticasone nasal spray as needed, and loratadine as needed.  I have also recommended nasal saline spray (i.e. Simply Saline) as needed prior to medicated nasal sprays.   Diagnostics: Spirometry reveals an FVC of 2.24 L and an FEV1 of 1.77 L (70% predicted) without significant postbronchodilator improvement.  Please see scanned spirometry results for details.     Physical examination: Blood pressure 114/70, pulse 64, temperature 98.4 F (36.9 C), temperature source Oral, resp. rate 20, SpO2 97 %.  General: Alert, interactive, in no acute distress. HEENT: TMs pearly gray, turbinates mildly edematous without discharge, post-pharynx mildly erythematous. Neck: Supple without lymphadenopathy. Lungs: Good air movement with mild rhonchi bilaterally. CV: Normal S1, S2 without murmurs. Skin: Warm and dry, without lesions or rashes.  The following portions of the patient's history were reviewed and updated as appropriate: allergies, current medications, past family history, past medical history, past social history, past surgical history and problem list.    Medication List       Accurate as of 04/13/16  1:45 PM. Always use your most recent med list.          ALBUTEROL SULFATE HFA IN Inhale into the lungs.   albuterol (2.5 MG/3ML) 0.083% nebulizer solution Commonly known as:  PROVENTIL Take 2.5 mg by nebulization every 4 (four) hours as needed for wheezing or shortness of breath.   albuterol-ipratropium 18-103 MCG/ACT inhaler Commonly known as:  COMBIVENT ONE PUFF FOUR TIMES A DAY   aspirin 81 MG tablet Take 81 mg by mouth daily.   beclomethasone 80 MCG/ACT inhaler Commonly known as:  QVAR INHALE 1-2 PUFFS TWO TIMES A DAY TO PREVENT COUGH OR WHEEZE. RINSE GARGLE AND SPIT AFTER USE   calcium carbonate 1500 (600 Ca) MG Tabs tablet Commonly known as:  OSCAL Take 1 tablet by mouth daily at 8 pm.   CALTRATE 600+D PO Take by mouth.   EPINEPHrine 0.3 mg/0.3 mL Soaj injection Commonly known as:  EPIPEN 2-PAK USE AS DIRECTED FOR SEVERE ALLERGIC  REACTION   fluticasone 50 MCG/ACT nasal spray Commonly known as:  FLONASE Place 1 spray into both nostrils 2 (two) times daily.   lisinopril 10 MG tablet Commonly known as:  PRINIVIL,ZESTRIL Take 10 mg by mouth daily. Reported on 10/13/2015   lisinopril-hydrochlorothiazide 10-12.5 MG  tablet Commonly known as:  PRINZIDE,ZESTORETIC Take by mouth.   loratadine 10 MG tablet Commonly known as:  CLARITIN Take 10 mg by mouth daily.   mometasone-formoterol 200-5 MCG/ACT Aero Commonly known as:  DULERA Inhale 2 puffs into the lungs 2 (two) times daily.   montelukast 10 MG tablet Commonly known as:  SINGULAIR Take 1 tablet (10 mg total) by mouth at bedtime.   omeprazole 20 MG tablet Commonly known as:  PRILOSEC OTC Take 40 mg by mouth daily.   OSTEO BI-FLEX JOINT SHIELD Tabs Take by mouth.       Allergies  Allergen Reactions  . Avelox [Moxifloxacin Hcl In Nacl] Nausea Only  . Spiriva Handihaler [Tiotropium Bromide Monohydrate] Swelling  . Tiotropium Swelling  . Sulfa Antibiotics Rash    I appreciate the opportunity to take part in Susan Lyons's care. Please do not hesitate to contact me with questions.  Sincerely,   R. Jorene Guest, MD

## 2016-04-13 NOTE — Patient Instructions (Addendum)
Severe persistent asthma  Due to perceived side effects, discontinue montelukast.  Continue Xolair injections every 4 weeks and Dulera 200/5 g, 2 inhalations twice a day. To maximize pulmonary deposition, a spacer has been provided along with instructions for its proper administration with an HFA inhaler.  During respiratory tract infections or asthma flares, add Qvar 80 g   to 2 inhalations 2 times per day until symptoms have returned to baseline.  Subjective and objective measures of pulmonary function will be followed and the treatment plan will be adjusted accordingly.  Allergic rhinitis  Continue appropriate allergen avoidance measures, fluticasone nasal spray as needed, and loratadine as needed.  I have also recommended nasal saline spray (i.e. Simply Saline) as needed prior to medicated nasal sprays.   Return in about 4 months (around 08/14/2016), or if symptoms worsen or fail to improve.

## 2016-04-13 NOTE — Assessment & Plan Note (Signed)
   Due to perceived side effects, discontinue montelukast.  Continue Xolair injections every 4 weeks and Dulera 200/5 g, 2 inhalations twice a day. To maximize pulmonary deposition, a spacer has been provided along with instructions for its proper administration with an HFA inhaler.  During respiratory tract infections or asthma flares, add Qvar 80 g  to 2 inhalations 2 times per day until symptoms have returned to baseline.  Subjective and objective measures of pulmonary function will be followed and the treatment plan will be adjusted accordingly.

## 2016-04-13 NOTE — Assessment & Plan Note (Signed)
   Continue appropriate allergen avoidance measures, fluticasone nasal spray as needed, and loratadine as needed.  I have also recommended nasal saline spray (i.e. Simply Saline) as needed prior to medicated nasal sprays.

## 2016-04-19 ENCOUNTER — Ambulatory Visit (INDEPENDENT_AMBULATORY_CARE_PROVIDER_SITE_OTHER): Payer: BLUE CROSS/BLUE SHIELD | Admitting: *Deleted

## 2016-04-19 DIAGNOSIS — J4540 Moderate persistent asthma, uncomplicated: Secondary | ICD-10-CM

## 2016-05-17 ENCOUNTER — Ambulatory Visit (INDEPENDENT_AMBULATORY_CARE_PROVIDER_SITE_OTHER): Payer: BLUE CROSS/BLUE SHIELD | Admitting: *Deleted

## 2016-05-17 DIAGNOSIS — J4540 Moderate persistent asthma, uncomplicated: Secondary | ICD-10-CM | POA: Diagnosis not present

## 2016-06-14 ENCOUNTER — Ambulatory Visit (INDEPENDENT_AMBULATORY_CARE_PROVIDER_SITE_OTHER): Payer: BLUE CROSS/BLUE SHIELD | Admitting: *Deleted

## 2016-06-14 DIAGNOSIS — J4540 Moderate persistent asthma, uncomplicated: Secondary | ICD-10-CM

## 2016-07-12 ENCOUNTER — Ambulatory Visit (INDEPENDENT_AMBULATORY_CARE_PROVIDER_SITE_OTHER): Payer: BLUE CROSS/BLUE SHIELD

## 2016-07-12 DIAGNOSIS — J4540 Moderate persistent asthma, uncomplicated: Secondary | ICD-10-CM | POA: Diagnosis not present

## 2016-08-09 ENCOUNTER — Ambulatory Visit: Payer: BLUE CROSS/BLUE SHIELD

## 2016-08-09 ENCOUNTER — Other Ambulatory Visit: Payer: Self-pay | Admitting: Pediatrics

## 2016-08-16 ENCOUNTER — Ambulatory Visit (INDEPENDENT_AMBULATORY_CARE_PROVIDER_SITE_OTHER): Payer: BLUE CROSS/BLUE SHIELD | Admitting: *Deleted

## 2016-08-16 ENCOUNTER — Encounter: Payer: Self-pay | Admitting: Allergy and Immunology

## 2016-08-16 ENCOUNTER — Ambulatory Visit (INDEPENDENT_AMBULATORY_CARE_PROVIDER_SITE_OTHER): Payer: BLUE CROSS/BLUE SHIELD | Admitting: Allergy and Immunology

## 2016-08-16 VITALS — BP 108/78 | HR 68 | Temp 98.1°F | Resp 20

## 2016-08-16 DIAGNOSIS — M818 Other osteoporosis without current pathological fracture: Secondary | ICD-10-CM

## 2016-08-16 DIAGNOSIS — K219 Gastro-esophageal reflux disease without esophagitis: Secondary | ICD-10-CM | POA: Diagnosis not present

## 2016-08-16 DIAGNOSIS — J309 Allergic rhinitis, unspecified: Secondary | ICD-10-CM | POA: Diagnosis not present

## 2016-08-16 DIAGNOSIS — J4540 Moderate persistent asthma, uncomplicated: Secondary | ICD-10-CM | POA: Diagnosis not present

## 2016-08-16 DIAGNOSIS — J4550 Severe persistent asthma, uncomplicated: Secondary | ICD-10-CM | POA: Diagnosis not present

## 2016-08-16 DIAGNOSIS — M810 Age-related osteoporosis without current pathological fracture: Secondary | ICD-10-CM | POA: Insufficient documentation

## 2016-08-16 NOTE — Assessment & Plan Note (Signed)
Suboptimal control since discontinuing proton pump inhibitor.  I recommended using ranitidine 150 mg twice a day on a scheduled basis rather than as needed.

## 2016-08-16 NOTE — Progress Notes (Signed)
Follow-up Note  RE: Susan Lyons MRN: [id number] DOB: [birth year] Date of Office Visit: 08/16/2016  Primary care provider: Pcp Not In System Referring provider: No ref. provider found  History of present illness: Susan Lyons is a 61 y.o. female with persistent asthma and allergic rhinitis presented today for follow up.  She was last seen in this clinic in October 2017.  She reports that in the interval since her previous visit her asthma has been well controlled.  She states that she requires albuterol rescue one or 2 times per month on average and denies nocturnal awakenings due to lower respiratory symptoms.  She is receiving omalizumab injections every month and takes Dulra 200-5 g, 2 inhalations via spacer device twice a day.  Her nasal allergy symptoms are well controlled and she has no nasal symptom complaints today.  She reports that she was recently diagnosed with osteoporosis.  She will go in next week for her initial administration of zoledronic acid.  Her proton pump inhibitor was discontinued.  She has been attempting to control her acid reflux with ranitidine as needed, however she still experiences painful heartburn occasionally.   Assessment and plan: Severe persistent asthma Well-controlled currently.  Continue Xolair injections every 4 weeks and Dulera 200/5 g, 2 inhalations via spacer device twice a day.   During respiratory tract infections or asthma flares, add Qvar 80 g  to 2 inhalations 2 times per day until symptoms have returned to baseline.  Subjective and objective measures of pulmonary function will be followed and the treatment plan will be adjusted accordingly.  Allergic rhinitis  Continue appropriate allergen avoidance measures, fluticasone nasal spray as needed, nasal saline irrigation as needed, and loratadine as needed.  Osteoporosis  She will receive her initial administration of zoledronic acid (Reclast) in the near future.  Continue  calcium citrate.  GERD (gastroesophageal reflux disease) Suboptimal control since discontinuing proton pump inhibitor.  I recommended using ranitidine 150 mg twice a day on a scheduled basis rather than as needed.   Diagnostics: Spirometry:  Normal with an FEV1 of 86% predicted.  Please see scanned spirometry results for details.    Physical examination: Blood pressure 108/78, pulse 68, temperature 98.1 F (36.7 C), temperature source Oral, resp. rate 20.  General: Alert, interactive, in no acute distress. HEENT: TMs pearly gray, turbinates mildly edematous without discharge, post-pharynx unremarkable. Neck: Supple without lymphadenopathy. Lungs: Clear to auscultation without wheezing, rhonchi or rales. CV: Normal S1, S2 without murmurs. Skin: Warm and dry, without lesions or rashes.  The following portions of the patient's history were reviewed and updated as appropriate: allergies, current medications, past family history, past medical history, past social history, past surgical history and problem list.  Allergies as of 08/16/2016      Reactions   Avelox [moxifloxacin Hcl In Nacl] Nausea Only   Spiriva Handihaler [tiotropium Bromide Monohydrate] Swelling   Tiotropium Swelling   Sulfa Antibiotics Rash      Medication List       Accurate as of 08/16/16  1:03 PM. Always use your most recent med list.          ALBUTEROL SULFATE HFA IN Inhale into the lungs.   albuterol (2.5 MG/3ML) 0.083% nebulizer solution Commonly known as:  PROVENTIL Take 2.5 mg by nebulization every 4 (four) hours as needed for wheezing or shortness of breath.   albuterol-ipratropium 18-103 MCG/ACT inhaler Commonly known as:  COMBIVENT ONE PUFF FOUR TIMES A DAY   aspirin 81 MG tablet  Take 81 mg by mouth daily.   beclomethasone 80 MCG/ACT inhaler Commonly known as:  QVAR INHALE 1-2 PUFFS TWO TIMES A DAY TO PREVENT COUGH OR WHEEZE. RINSE GARGLE AND SPIT AFTER USE   betamethasone valerate  ointment 0.1 % Commonly known as:  VALISONE Apply 1 application topically as needed.   calcium carbonate 1500 (600 Ca) MG Tabs tablet Commonly known as:  OSCAL Take 1 tablet by mouth daily at 8 pm.   CALTRATE 600+D PO Take by mouth.   DULERA 200-5 MCG/ACT Aero Generic drug:  mometasone-formoterol INHALE 2 PUFFS INTO THE LUNGS 2 (TWO) TIMES DAILY.   EPINEPHrine 0.3 mg/0.3 mL Soaj injection Commonly known as:  EPIPEN 2-PAK USE AS DIRECTED FOR SEVERE ALLERGIC REACTION   fluticasone 50 MCG/ACT nasal spray Commonly known as:  FLONASE Place 1 spray into both nostrils 2 (two) times daily.   lisinopril 10 MG tablet Commonly known as:  PRINIVIL,ZESTRIL Take 10 mg by mouth daily. Reported on 10/13/2015   lisinopril-hydrochlorothiazide 10-12.5 MG tablet Commonly known as:  PRINZIDE,ZESTORETIC Take by mouth.   loratadine 10 MG tablet Commonly known as:  CLARITIN Take 10 mg by mouth daily.   montelukast 10 MG tablet Commonly known as:  SINGULAIR Take 1 tablet (10 mg total) by mouth at bedtime.   omeprazole 20 MG tablet Commonly known as:  PRILOSEC OTC Take 40 mg by mouth daily.   OSTEO BI-FLEX JOINT SHIELD Tabs Take by mouth.   triamcinolone cream 0.1 % Commonly known as:  KENALOG Apply 1 application topically as needed.       Allergies  Allergen Reactions  . Avelox [Moxifloxacin Hcl In Nacl] Nausea Only  . Spiriva Handihaler [Tiotropium Bromide Monohydrate] Swelling  . Tiotropium Swelling  . Sulfa Antibiotics Rash   Review of systems: Review of systems negative except as noted in HPI / PMHx or noted below: Constitutional: Negative.  HENT: Negative.   Eyes: Negative.  Respiratory: Negative.   Cardiovascular: Negative.  Gastrointestinal: Negative.  Genitourinary: Negative.  Musculoskeletal: Negative.  Neurological: Negative.  Endo/Heme/Allergies: Negative.  Cutaneous: Negative.  Past Medical History:  Diagnosis Date  . Asthma   . Hypertension   .  Osteoporosis     Family History  Problem Relation Age of Onset  . Lung cancer Mother   . Asthma Father   . Allergic rhinitis Neg Hx   . Angioedema Neg Hx   . Eczema Neg Hx   . Immunodeficiency Neg Hx   . Urticaria Neg Hx     Social History   Social History  . Marital status: Married    Spouse name: N/A  . Number of children: N/A  . Years of education: N/A   Occupational History  . Not on file.   Social History Main Topics  . Smoking status: Never Smoker  . Smokeless tobacco: Never Used  . Alcohol use No  . Drug use: No  . Sexual activity: Not on file   Other Topics Concern  . Not on file   Social History Narrative  . No narrative on file    I appreciate the opportunity to take part in Susan Lyons's care. Please do not hesitate to contact me with questions.  Sincerely,   R. Jorene Guestarter Shavone Nevers, MD

## 2016-08-16 NOTE — Assessment & Plan Note (Signed)
   Continue appropriate allergen avoidance measures, fluticasone nasal spray as needed, nasal saline irrigation as needed, and loratadine as needed.

## 2016-08-16 NOTE — Assessment & Plan Note (Signed)
Well-controlled currently.  Continue Xolair injections every 4 weeks and Dulera 200/5 g, 2 inhalations via spacer device twice a day.   During respiratory tract infections or asthma flares, add Qvar 80 g  to 2 inhalations 2 times per day until symptoms have returned to baseline.  Subjective and objective measures of pulmonary function will be followed and the treatment plan will be adjusted accordingly.

## 2016-08-16 NOTE — Patient Instructions (Signed)
Severe persistent asthma Well-controlled currently.  Continue Xolair injections every 4 weeks and Dulera 200/5 g, 2 inhalations via spacer device twice a day.   During respiratory tract infections or asthma flares, add Qvar 80 g  to 2 inhalations 2 times per day until symptoms have returned to baseline.  Subjective and objective measures of pulmonary function will be followed and the treatment plan will be adjusted accordingly.  Allergic rhinitis  Continue appropriate allergen avoidance measures, fluticasone nasal spray as needed, nasal saline irrigation as needed, and loratadine as needed.  Osteoporosis  She will receive her initial administration of zoledronic acid (Reclast) in the near future.  Continue calcium citrate.  GERD (gastroesophageal reflux disease) Suboptimal control since discontinuing proton pump inhibitor.  I recommended using ranitidine 150 mg twice a day on a scheduled basis rather than as needed.   Return in about 4 months (around 12/14/2016), or if symptoms worsen or fail to improve.

## 2016-08-16 NOTE — Assessment & Plan Note (Signed)
   She will receive her initial administration of zoledronic acid (Reclast) in the near future.  Continue calcium citrate.

## 2016-09-09 ENCOUNTER — Other Ambulatory Visit: Payer: Self-pay | Admitting: Allergy and Immunology

## 2016-09-13 ENCOUNTER — Ambulatory Visit (INDEPENDENT_AMBULATORY_CARE_PROVIDER_SITE_OTHER): Payer: BLUE CROSS/BLUE SHIELD | Admitting: *Deleted

## 2016-09-13 DIAGNOSIS — J4540 Moderate persistent asthma, uncomplicated: Secondary | ICD-10-CM

## 2016-10-10 ENCOUNTER — Other Ambulatory Visit: Payer: Self-pay | Admitting: Allergy and Immunology

## 2016-10-16 ENCOUNTER — Ambulatory Visit: Payer: Self-pay

## 2016-10-16 ENCOUNTER — Ambulatory Visit (INDEPENDENT_AMBULATORY_CARE_PROVIDER_SITE_OTHER): Payer: BLUE CROSS/BLUE SHIELD

## 2016-10-16 DIAGNOSIS — J4540 Moderate persistent asthma, uncomplicated: Secondary | ICD-10-CM | POA: Diagnosis not present

## 2016-11-13 ENCOUNTER — Ambulatory Visit (INDEPENDENT_AMBULATORY_CARE_PROVIDER_SITE_OTHER): Payer: BLUE CROSS/BLUE SHIELD | Admitting: *Deleted

## 2016-11-13 DIAGNOSIS — J4540 Moderate persistent asthma, uncomplicated: Secondary | ICD-10-CM | POA: Diagnosis not present

## 2016-11-13 DIAGNOSIS — J309 Allergic rhinitis, unspecified: Secondary | ICD-10-CM

## 2016-12-11 ENCOUNTER — Ambulatory Visit: Payer: Self-pay

## 2016-12-13 ENCOUNTER — Encounter: Payer: Self-pay | Admitting: Allergy and Immunology

## 2016-12-13 ENCOUNTER — Ambulatory Visit: Payer: Self-pay

## 2016-12-13 ENCOUNTER — Ambulatory Visit (INDEPENDENT_AMBULATORY_CARE_PROVIDER_SITE_OTHER): Payer: BLUE CROSS/BLUE SHIELD | Admitting: Allergy and Immunology

## 2016-12-13 VITALS — BP 112/70 | HR 70 | Temp 97.8°F | Resp 20

## 2016-12-13 DIAGNOSIS — K219 Gastro-esophageal reflux disease without esophagitis: Secondary | ICD-10-CM

## 2016-12-13 DIAGNOSIS — M818 Other osteoporosis without current pathological fracture: Secondary | ICD-10-CM

## 2016-12-13 DIAGNOSIS — J4550 Severe persistent asthma, uncomplicated: Secondary | ICD-10-CM | POA: Diagnosis not present

## 2016-12-13 DIAGNOSIS — J3089 Other allergic rhinitis: Secondary | ICD-10-CM

## 2016-12-13 MED ORDER — FLUTICASONE PROPIONATE HFA 110 MCG/ACT IN AERO: 2.0000 | INHALATION_SPRAY | Freq: Two times a day (BID) | RESPIRATORY_TRACT | 5 refills | Status: DC

## 2016-12-13 NOTE — Assessment & Plan Note (Signed)
   Continue appropriate reflux lifestyle modifications and ranitidine 150 mg twice a day.

## 2016-12-13 NOTE — Assessment & Plan Note (Signed)
Stable.  Continue appropriate allergen avoidance measures, fluticasone nasal spray as needed, nasal saline irrigation as needed, and loratadine as needed.

## 2016-12-13 NOTE — Progress Notes (Signed)
Follow-up Note  RE: Susan Lyons MRN: [id number] DOB: 1956-05-31 Date of Office Visit: 12/13/2016  Primary care provider: System, Pcp Not In Referring provider: No ref. provider found  History of present illness: Susan Lyons is a 61 y.o. female with persistent asthma, allergic rhinitis, and gastroesophageal reflux presenting today for follow up.  She was last seen in this clinic on 08/16/2016.  She reports that overall her asthma has been well controlled.  She typically requires albuterol rescue one time per week on average.  Her lower respiratory symptoms occur randomly while at rest.  She denies nocturnal awakenings due to dyspnea, chest tightness, or wheezing.  Her acid reflux is well-controlled and she rarely requires Tums.  She does her best to avoid chocolate and soda.  She experienced some nasal congestion earlier this spring with pollen exposure, however now her nasal symptoms are well controlled.  She has no complaints today.   Assessment and plan: Severe persistent asthma Well-controlled with the current treatment plan.  Continue Xolair injections every 4 weeks and Dulera 200/5 g, 2 inhalations via spacer device twice a day.   During respiratory tract infections or asthma flares, add Flovent 110g 2 inhalations 2 times per day until symptoms have returned to baseline.  A prescription has been provided for Flovent.  Subjective and objective measures of pulmonary function will be followed and the treatment plan will be adjusted accordingly.  Other allergic rhinitis Stable.  Continue appropriate allergen avoidance measures, fluticasone nasal spray as needed, nasal saline irrigation as needed, and loratadine as needed.  GERD (gastroesophageal reflux disease)  Continue appropriate reflux lifestyle modifications and ranitidine 150 mg twice a day.  Osteoporosis  Continue zoledronic acid (Reclast) yearly, calcium citrate, and vitamin D.   Meds ordered this encounter   Medications  . fluticasone (FLOVENT HFA) 110 MCG/ACT inhaler    Sig: Inhale 2 puffs into the lungs 2 (two) times daily.    Dispense:  1 Inhaler    Refill:  5    Diagnostics: Spirometry reveals an FVC of 2.07 L and an FEV1 of 1.67 L (79% predicted) with an FEV1 ratio of 104%.  Mild restrictive pattern.  Please see scanned spirometry results for details.    Physical examination: Blood pressure 112/70, pulse 70, temperature 97.8 F (36.6 C), temperature source Oral, resp. rate 20, SpO2 96 %.  General: Alert, interactive, in no acute distress. HEENT: TMs pearly gray, turbinates mildly edematous without discharge, post-pharynx unremarkable. Neck: Supple without lymphadenopathy. Lungs: Clear to auscultation without wheezing, rhonchi or rales. CV: Normal S1, S2 without murmurs. Skin: Warm and dry, without lesions or rashes.  The following portions of the patient's history were reviewed and updated as appropriate: allergies, current medications, past family history, past medical history, past social history, past surgical history and problem list.  Allergies as of 12/13/2016      Reactions   Avelox [moxifloxacin Hcl In Nacl] Nausea Only   Spiriva Handihaler [tiotropium Bromide Monohydrate] Swelling   Tiotropium Swelling   Sulfa Antibiotics Rash      Medication List       Accurate as of 12/13/16 11:41 AM. Always use your most recent med list.          ALBUTEROL SULFATE HFA IN Inhale into the lungs.   albuterol (2.5 MG/3ML) 0.083% nebulizer solution Commonly known as:  PROVENTIL Take 2.5 mg by nebulization every 4 (four) hours as needed for wheezing or shortness of breath.   albuterol-ipratropium 18-103 MCG/ACT inhaler Commonly known  as:  COMBIVENT ONE PUFF FOUR TIMES A DAY   aspirin 81 MG tablet Take 81 mg by mouth daily.   beclomethasone 80 MCG/ACT inhaler Commonly known as:  QVAR INHALE 1-2 PUFFS TWO TIMES A DAY TO PREVENT COUGH OR WHEEZE. RINSE GARGLE AND SPIT AFTER  USE   betamethasone valerate ointment 0.1 % Commonly known as:  VALISONE Apply 1 application topically as needed.   calcium carbonate 1500 (600 Ca) MG Tabs tablet Commonly known as:  OSCAL Take 1 tablet by mouth daily at 8 pm.   CALTRATE 600+D PO Take by mouth.   DULERA 200-5 MCG/ACT Aero Generic drug:  mometasone-formoterol INHALE 2 PUFFS INTO THE LUNGS 2 (TWO) TIMES DAILY.   EPINEPHrine 0.3 mg/0.3 mL Soaj injection Commonly known as:  EPIPEN 2-PAK USE AS DIRECTED FOR SEVERE ALLERGIC REACTION   fluticasone 110 MCG/ACT inhaler Commonly known as:  FLOVENT HFA Inhale 2 puffs into the lungs 2 (two) times daily.   fluticasone 50 MCG/ACT nasal spray Commonly known as:  FLONASE Place 1 spray into both nostrils 2 (two) times daily.   lisinopril 10 MG tablet Commonly known as:  PRINIVIL,ZESTRIL Take 10 mg by mouth daily. Reported on 10/13/2015   lisinopril-hydrochlorothiazide 10-12.5 MG tablet Commonly known as:  PRINZIDE,ZESTORETIC Take by mouth.   loratadine 10 MG tablet Commonly known as:  CLARITIN Take 10 mg by mouth daily.   montelukast 10 MG tablet Commonly known as:  SINGULAIR Take 1 tablet (10 mg total) by mouth at bedtime.   omeprazole 20 MG tablet Commonly known as:  PRILOSEC OTC Take 40 mg by mouth daily.   OSTEO BI-FLEX JOINT SHIELD Tabs Take by mouth.   ranitidine 150 MG tablet Commonly known as:  ZANTAC Take 150 mg by mouth daily.   triamcinolone cream 0.1 % Commonly known as:  KENALOG Apply 1 application topically as needed.       Allergies  Allergen Reactions  . Avelox [Moxifloxacin Hcl In Nacl] Nausea Only  . Spiriva Handihaler [Tiotropium Bromide Monohydrate] Swelling  . Tiotropium Swelling  . Sulfa Antibiotics Rash    I appreciate the opportunity to take part in [place]'s care. Please do not hesitate to contact me with questions.  Sincerely,   R. Jorene Guestarter Shavonda Wiedman, MD

## 2016-12-13 NOTE — Assessment & Plan Note (Signed)
Well-controlled with the current treatment plan.  Continue Xolair injections every 4 weeks and Dulera 200/5 g, 2 inhalations via spacer device twice a day.   During respiratory tract infections or asthma flares, add Flovent 110g 2 inhalations 2 times per day until symptoms have returned to baseline.  A prescription has been provided for Flovent.  Subjective and objective measures of pulmonary function will be followed and the treatment plan will be adjusted accordingly.

## 2016-12-13 NOTE — Patient Instructions (Addendum)
Severe persistent asthma Well-controlled with the current treatment plan.  Continue Xolair injections every 4 weeks and Dulera 200/5 g, 2 inhalations via spacer device twice a day.   During respiratory tract infections or asthma flares, add Flovent 110g 2 inhalations 2 times per day until symptoms have returned to baseline.  A prescription has been provided for Flovent.  Subjective and objective measures of pulmonary function will be followed and the treatment plan will be adjusted accordingly.  Other allergic rhinitis Stable.  Continue appropriate allergen avoidance measures, fluticasone nasal spray as needed, nasal saline irrigation as needed, and loratadine as needed.  GERD (gastroesophageal reflux disease)  Continue appropriate reflux lifestyle modifications and ranitidine 150 mg twice a day.  Osteoporosis  Continue zoledronic acid (Reclast) yearly, calcium citrate, and vitamin D.   Return in about 4 months (around 04/14/2017), or if symptoms worsen or fail to improve.

## 2016-12-13 NOTE — Assessment & Plan Note (Signed)
   Continue zoledronic acid (Reclast) yearly, calcium citrate, and vitamin D.

## 2016-12-15 ENCOUNTER — Ambulatory Visit (INDEPENDENT_AMBULATORY_CARE_PROVIDER_SITE_OTHER): Payer: BLUE CROSS/BLUE SHIELD | Admitting: *Deleted

## 2016-12-15 DIAGNOSIS — J4540 Moderate persistent asthma, uncomplicated: Secondary | ICD-10-CM

## 2017-01-06 ENCOUNTER — Other Ambulatory Visit: Payer: Self-pay | Admitting: Allergy and Immunology

## 2017-01-12 ENCOUNTER — Ambulatory Visit (INDEPENDENT_AMBULATORY_CARE_PROVIDER_SITE_OTHER): Payer: BLUE CROSS/BLUE SHIELD

## 2017-01-12 DIAGNOSIS — J4540 Moderate persistent asthma, uncomplicated: Secondary | ICD-10-CM | POA: Diagnosis not present

## 2017-02-09 ENCOUNTER — Ambulatory Visit: Payer: Self-pay

## 2017-02-19 ENCOUNTER — Ambulatory Visit (INDEPENDENT_AMBULATORY_CARE_PROVIDER_SITE_OTHER): Payer: BLUE CROSS/BLUE SHIELD | Admitting: *Deleted

## 2017-02-19 DIAGNOSIS — J4540 Moderate persistent asthma, uncomplicated: Secondary | ICD-10-CM

## 2017-03-13 ENCOUNTER — Telehealth: Payer: Self-pay | Admitting: Allergy

## 2017-03-13 NOTE — Telephone Encounter (Signed)
Left message for patient to call back  

## 2017-03-13 NOTE — Telephone Encounter (Signed)
Can you look into this for this pt please

## 2017-03-13 NOTE — Telephone Encounter (Signed)
[facility] Advised her to contact them and make sure they are using her copay card to make cost of drug $5

## 2017-03-13 NOTE — Telephone Encounter (Signed)
Patient called and wanted to talk to you about Xolair.Patient is having to pay about $500.00 a month with co-pay. Patient wanted to know if there was anything any cheaper to do than the shots. If not patient said she needed to go off of Xolair, Phone [phone] mobile

## 2017-03-13 NOTE — Telephone Encounter (Signed)
Patient called back and I told her to call Tammy.or call back and talk to [place].

## 2017-03-13 NOTE — Telephone Encounter (Signed)
Please direct her to Tammy and Federico FlakeKathy Trivette to see if there is any way to work around the cost problem of Xolair with her.  If not, we will consider anti-IL-5 therapy if that is less expensive.

## 2017-03-19 ENCOUNTER — Ambulatory Visit (INDEPENDENT_AMBULATORY_CARE_PROVIDER_SITE_OTHER): Payer: BLUE CROSS/BLUE SHIELD | Admitting: *Deleted

## 2017-03-19 DIAGNOSIS — J4540 Moderate persistent asthma, uncomplicated: Secondary | ICD-10-CM | POA: Diagnosis not present

## 2017-04-05 ENCOUNTER — Telehealth: Payer: Self-pay | Admitting: *Deleted

## 2017-04-05 NOTE — Telephone Encounter (Signed)
Patient has been having sinus pressure for 2 weeks with pressure behind eyes. She is doing nasal irrigations. I recommended trying mucinex to help relieve sinus pressure and to drink plenty of water with it and to stop Claritin while using mucinex and also continue with saline rinse. Pt will call us back if she is not feeling better.

## 2017-04-05 NOTE — Telephone Encounter (Signed)
Patient would like a return call from a nurse. Says she's been having sinus headaches and needs to speak with a nurse.

## 2017-04-16 ENCOUNTER — Ambulatory Visit (INDEPENDENT_AMBULATORY_CARE_PROVIDER_SITE_OTHER): Payer: BLUE CROSS/BLUE SHIELD

## 2017-04-16 DIAGNOSIS — J4540 Moderate persistent asthma, uncomplicated: Secondary | ICD-10-CM

## 2017-04-18 DIAGNOSIS — K760 Fatty (change of) liver, not elsewhere classified: Secondary | ICD-10-CM | POA: Insufficient documentation

## 2017-04-18 DIAGNOSIS — R3129 Other microscopic hematuria: Secondary | ICD-10-CM | POA: Insufficient documentation

## 2017-04-18 DIAGNOSIS — J45909 Unspecified asthma, uncomplicated: Secondary | ICD-10-CM | POA: Insufficient documentation

## 2017-04-25 ENCOUNTER — Other Ambulatory Visit: Payer: Self-pay | Admitting: Allergy and Immunology

## 2017-05-14 ENCOUNTER — Ambulatory Visit (INDEPENDENT_AMBULATORY_CARE_PROVIDER_SITE_OTHER): Payer: BLUE CROSS/BLUE SHIELD

## 2017-05-14 DIAGNOSIS — J4540 Moderate persistent asthma, uncomplicated: Secondary | ICD-10-CM

## 2017-05-16 ENCOUNTER — Ambulatory Visit: Payer: BLUE CROSS/BLUE SHIELD | Admitting: Allergy and Immunology

## 2017-05-16 ENCOUNTER — Encounter: Payer: Self-pay | Admitting: Allergy and Immunology

## 2017-05-16 VITALS — BP 118/80 | HR 64 | Temp 98.9°F | Resp 20

## 2017-05-16 DIAGNOSIS — K219 Gastro-esophageal reflux disease without esophagitis: Secondary | ICD-10-CM

## 2017-05-16 DIAGNOSIS — M818 Other osteoporosis without current pathological fracture: Secondary | ICD-10-CM | POA: Diagnosis not present

## 2017-05-16 DIAGNOSIS — J4550 Severe persistent asthma, uncomplicated: Secondary | ICD-10-CM | POA: Diagnosis not present

## 2017-05-16 DIAGNOSIS — J3089 Other allergic rhinitis: Secondary | ICD-10-CM | POA: Diagnosis not present

## 2017-05-16 MED ORDER — MOMETASONE FURO-FORMOTEROL FUM 200-5 MCG/ACT IN AERO: 2.0000 | INHALATION_SPRAY | Freq: Two times a day (BID) | RESPIRATORY_TRACT | 2 refills | Status: DC

## 2017-05-16 MED ORDER — FLUTICASONE PROPIONATE 50 MCG/ACT NA SUSP: 1.0000 | Freq: Two times a day (BID) | NASAL | 5 refills | Status: DC

## 2017-05-16 NOTE — Assessment & Plan Note (Signed)
   Continue current treatment plan as directed and monitored by primary care physician.

## 2017-05-16 NOTE — Progress Notes (Signed)
Follow-up Note  RE: Susan Lyons MRN: [id number] DOB: 04/02/1956 Date of Office Visit: 05/16/2017  Primary care provider: System, Pcp Not In Referring provider: No ref. provider found  History of present illness: Susan Lyons is a 61 y.o. female with persistent asthma on Xolair, allergic rhinitis, and gastroesophageal reflux presenting today for follow-up.  She was last seen in this clinic on December 13, 2016.  She is currently receiving Xolair injections once a month, and taking Dulera 200-5 g, 2 inhalations via spacer device twice daily.  On this regimen she has only required albuterol rescue 1 or 2 times per month on average and denies nocturnal awakenings due to lower respiratory symptoms.  She has Flovent on hand to add during asthma flares.  She reports that she recently had experienced sinus pressure, however this has been improving with nasal saline lavage and fluticasone nasal spray.  Her acid reflux is currently well controlled.   Assessment and plan: Severe persistent asthma Well-controlled.  Continue Xolair injections every 4 weeks and Dulera 200/5 g, 2 inhalations via spacer device twice a day.   During respiratory tract infections or asthma flares, add Flovent 110g 2 inhalations 2 times per day until symptoms have returned to baseline.  Subjective and objective measures of pulmonary function will be followed and the treatment plan will be adjusted accordingly.  Other allergic rhinitis Stable.  Continue appropriate allergen avoidance measures, fluticasone nasal spray as needed, nasal saline irrigation as needed, and loratadine if needed.  GERD (gastroesophageal reflux disease) Well controlled.  Continue appropriate reflux lifestyle modifications and ranitidine 150 mg twice a day.  Osteoporosis  Continue current treatment plan as directed and monitored by primary care physician.   Meds ordered this encounter  Medications  . mometasone-formoterol (DULERA)  200-5 MCG/ACT AERO    Sig: Inhale 2 puffs 2 (two) times daily into the lungs.    Dispense:  13 g    Refill:  2  . fluticasone (FLONASE) 50 MCG/ACT nasal spray    Sig: Place 1 spray 2 (two) times daily into both nostrils.    Dispense:  16 g    Refill:  5    Diagnostics: Spirometry reveals an FVC of 2.08 L and an FEV1 of 1.70 L (81% predicted) with an FEV1 ratio of 106%.  Her FEV1 is slightly improved compared with previous study.  Please see scanned spirometry results for details.    Physical examination: Blood pressure 118/80, pulse 64, temperature 98.9 F (37.2 C), temperature source Oral, resp. rate 20, SpO2 98 %.  General: Alert, interactive, in no acute distress. HEENT: TMs pearly gray, turbinates mildly edematous without discharge, post-pharynx mildly erythematous. Neck: Supple without lymphadenopathy. Lungs: Clear to auscultation without wheezing, rhonchi or rales. CV: Normal S1, S2 without murmurs. Skin: Warm and dry, without lesions or rashes.  The following portions of the patient's history were reviewed and updated as appropriate: allergies, current medications, past family history, past medical history, past social history, past surgical history and problem list.  Allergies as of 05/16/2017      Reactions   Avelox [moxifloxacin Hcl In Nacl] Nausea Only   Spiriva Handihaler [tiotropium Bromide Monohydrate] Swelling   Tiotropium Swelling   Sulfa Antibiotics Rash      Medication List        Accurate as of 05/16/17 12:38 PM. Always use your most recent med list.          albuterol (2.5 MG/3ML) 0.083% nebulizer solution Commonly known as:  PROVENTIL  2.5 mg every 6 (six) hours as needed for Wheezing or Shortness of Breath.   aspirin 81 MG tablet Take 81 mg by mouth daily.   betamethasone valerate ointment 0.1 % Commonly known as:  VALISONE Apply 1 application topically as needed.   COMBIVENT IN Inhale into the lungs.   EPINEPHrine 0.3 mg/0.3 mL Soaj  injection Commonly known as:  EPIPEN 2-PAK USE AS DIRECTED FOR SEVERE ALLERGIC REACTION   fluticasone 110 MCG/ACT inhaler Commonly known as:  FLOVENT HFA Inhale into the lungs.   fluticasone 50 MCG/ACT nasal spray Commonly known as:  FLONASE Place 1 spray 2 (two) times daily into both nostrils.   lisinopril-hydrochlorothiazide 10-12.5 MG tablet Commonly known as:  PRINZIDE,ZESTORETIC Take by mouth.   loratadine 10 MG tablet Commonly known as:  CLARITIN Take 10 mg by mouth daily.   mometasone-formoterol 200-5 MCG/ACT Aero Commonly known as:  DULERA Inhale 2 puffs 2 (two) times daily into the lungs.   montelukast 10 MG tablet Commonly known as:  SINGULAIR Take 1 tablet (10 mg total) by mouth at bedtime.   OSTEO BI-FLEX JOINT SHIELD Tabs Take by mouth.   ranitidine 150 MG tablet Commonly known as:  ZANTAC Take 150 mg by mouth daily.   triamcinolone cream 0.1 % Commonly known as:  KENALOG Apply 1 application topically as needed.   zoledronic acid 5 MG/100ML Soln injection Commonly known as:  RECLAST Inject into the vein.       Allergies  Allergen Reactions  . Avelox [Moxifloxacin Hcl In Nacl] Nausea Only  . Spiriva Handihaler [Tiotropium Bromide Monohydrate] Swelling  . Tiotropium Swelling  . Sulfa Antibiotics Rash   Review of systems: Review of systems negative except as noted in HPI / PMHx or noted below: Constitutional: Negative.  HENT: Negative.   Eyes: Negative.  Respiratory: Negative.   Cardiovascular: Negative.  Gastrointestinal: Negative.  Genitourinary: Negative.  Musculoskeletal: Negative.  Neurological: Negative.  Endo/Heme/Allergies: Negative.  Cutaneous: Negative.  Past Medical History:  Diagnosis Date  . Asthma   . Hypertension   . Osteoporosis     Family History  Problem Relation Age of Onset  . Lung cancer Mother   . Asthma Father   . Allergic rhinitis Neg Hx   . Angioedema Neg Hx   . Eczema Neg Hx   . Immunodeficiency Neg  Hx   . Urticaria Neg Hx     Social History   Socioeconomic History  . Marital status: Married    Spouse name: Not on file  . Number of children: Not on file  . Years of education: Not on file  . Highest education level: Not on file  Social Needs  . Financial resource strain: Not on file  . Food insecurity - worry: Not on file  . Food insecurity - inability: Not on file  . Transportation needs - medical: Not on file  . Transportation needs - non-medical: Not on file  Occupational History  . Not on file  Tobacco Use  . Smoking status: Never Smoker  . Smokeless tobacco: Never Used  Substance and Sexual Activity  . Alcohol use: No  . Drug use: No  . Sexual activity: Not on file  Other Topics Concern  . Not on file  Social History Narrative  . Not on file     I appreciate the opportunity to take part in Colette's care. Please do not hesitate to contact me with questions.  Sincerely,   R. Jorene Guestarter Mardi Cannady, MD

## 2017-05-16 NOTE — Assessment & Plan Note (Signed)
Well controlled.  Continue appropriate reflux lifestyle modifications and ranitidine 150 mg twice a day.

## 2017-05-16 NOTE — Patient Instructions (Signed)
Severe persistent asthma Well-controlled.  Continue Xolair injections every 4 weeks and Dulera 200/5 g, 2 inhalations via spacer device twice a day.   During respiratory tract infections or asthma flares, add Flovent 110g 2 inhalations 2 times per day until symptoms have returned to baseline.  Subjective and objective measures of pulmonary function will be followed and the treatment plan will be adjusted accordingly.  Other allergic rhinitis Stable.  Continue appropriate allergen avoidance measures, fluticasone nasal spray as needed, nasal saline irrigation as needed, and loratadine if needed.  GERD (gastroesophageal reflux disease) Well controlled.  Continue appropriate reflux lifestyle modifications and ranitidine 150 mg twice a day.  Osteoporosis  Continue current treatment plan as directed and monitored by primary care physician.   Return in about 4 months (around 09/13/2017), or if symptoms worsen or fail to improve.

## 2017-05-16 NOTE — Assessment & Plan Note (Addendum)
Stable.  Continue appropriate allergen avoidance measures, fluticasone nasal spray as needed, nasal saline irrigation as needed, and loratadine if needed.

## 2017-05-16 NOTE — Assessment & Plan Note (Signed)
Well-controlled.  Continue Xolair injections every 4 weeks and Dulera 200/5 g, 2 inhalations via spacer device twice a day.   During respiratory tract infections or asthma flares, add Flovent 110g 2 inhalations 2 times per day until symptoms have returned to baseline.  Subjective and objective measures of pulmonary function will be followed and the treatment plan will be adjusted accordingly.

## 2017-06-11 ENCOUNTER — Ambulatory Visit: Payer: Self-pay

## 2017-06-18 ENCOUNTER — Ambulatory Visit (INDEPENDENT_AMBULATORY_CARE_PROVIDER_SITE_OTHER): Payer: BLUE CROSS/BLUE SHIELD

## 2017-06-18 DIAGNOSIS — J4550 Severe persistent asthma, uncomplicated: Secondary | ICD-10-CM

## 2017-07-16 ENCOUNTER — Ambulatory Visit (INDEPENDENT_AMBULATORY_CARE_PROVIDER_SITE_OTHER): Payer: BLUE CROSS/BLUE SHIELD

## 2017-07-16 DIAGNOSIS — J4540 Moderate persistent asthma, uncomplicated: Secondary | ICD-10-CM

## 2017-08-13 ENCOUNTER — Ambulatory Visit (INDEPENDENT_AMBULATORY_CARE_PROVIDER_SITE_OTHER): Payer: BLUE CROSS/BLUE SHIELD

## 2017-08-13 DIAGNOSIS — J4540 Moderate persistent asthma, uncomplicated: Secondary | ICD-10-CM

## 2017-08-22 ENCOUNTER — Other Ambulatory Visit: Payer: Self-pay | Admitting: Allergy

## 2017-08-22 MED ORDER — EPINEPHRINE 0.3 MG/0.3ML IJ SOAJ: INTRAMUSCULAR | 1 refills | Status: DC

## 2017-09-10 ENCOUNTER — Ambulatory Visit (INDEPENDENT_AMBULATORY_CARE_PROVIDER_SITE_OTHER): Payer: BLUE CROSS/BLUE SHIELD

## 2017-09-10 DIAGNOSIS — J4540 Moderate persistent asthma, uncomplicated: Secondary | ICD-10-CM | POA: Diagnosis not present

## 2017-10-10 ENCOUNTER — Ambulatory Visit: Payer: Self-pay

## 2017-10-11 ENCOUNTER — Ambulatory Visit: Payer: BLUE CROSS/BLUE SHIELD | Admitting: Allergy and Immunology

## 2017-10-11 ENCOUNTER — Ambulatory Visit (INDEPENDENT_AMBULATORY_CARE_PROVIDER_SITE_OTHER): Payer: BLUE CROSS/BLUE SHIELD

## 2017-10-11 ENCOUNTER — Encounter: Payer: Self-pay | Admitting: Allergy and Immunology

## 2017-10-11 VITALS — BP 126/78 | HR 72 | Temp 97.5°F | Resp 16 | Ht 60.0 in | Wt 154.4 lb

## 2017-10-11 DIAGNOSIS — J3089 Other allergic rhinitis: Secondary | ICD-10-CM

## 2017-10-11 DIAGNOSIS — K219 Gastro-esophageal reflux disease without esophagitis: Secondary | ICD-10-CM | POA: Diagnosis not present

## 2017-10-11 DIAGNOSIS — J4550 Severe persistent asthma, uncomplicated: Secondary | ICD-10-CM | POA: Diagnosis not present

## 2017-10-11 DIAGNOSIS — J4540 Moderate persistent asthma, uncomplicated: Secondary | ICD-10-CM | POA: Diagnosis not present

## 2017-10-11 MED ORDER — MOMETASONE FURO-FORMOTEROL FUM 200-5 MCG/ACT IN AERO: 2.0000 | INHALATION_SPRAY | Freq: Two times a day (BID) | RESPIRATORY_TRACT | 2 refills | Status: DC

## 2017-10-11 MED ORDER — AZELASTINE HCL 0.15 % NA SOLN: 1.0000 | Freq: Two times a day (BID) | NASAL | 3 refills | Status: DC | PRN

## 2017-10-11 NOTE — Assessment & Plan Note (Signed)
   Continue appropriate allergen avoidance measures.  A prescription has been provided for azelastine nasal spray, 1-2 sprays per nostril 2 times daily as needed.   Continue fluticasone nasal spray, 2 sprays per nostril daily if needed.  Nasal saline spray (i.e., Simply Saline) or nasal saline lavage (i.e., NeilMed) is recommended as needed and prior to medicated nasal sprays.

## 2017-10-11 NOTE — Addendum Note (Signed)
Addended byClyda Greener: Suzy Kugel M on: 10/11/2017 11:49 AM   Modules accepted: Orders

## 2017-10-11 NOTE — Patient Instructions (Signed)
Severe persistent asthma Well-controlled.  Continue Xolair injections every 4 weeks and Dulera 200/5 g, 2 inhalations via spacer device twice a day.   During respiratory tract infections or asthma flares, add Flovent 110g 2 inhalations 2 times per day until symptoms have returned to baseline.  Subjective and objective measures of pulmonary function will be followed and the treatment plan will be adjusted accordingly.  Other allergic rhinitis  Continue appropriate allergen avoidance measures.  A prescription has been provided for azelastine nasal spray, 1-2 sprays per nostril 2 times daily as needed.   Continue fluticasone nasal spray, 2 sprays per nostril daily if needed.  Nasal saline spray (i.e., Simply Saline) or nasal saline lavage (i.e., NeilMed) is recommended as needed and prior to medicated nasal sprays.  GERD (gastroesophageal reflux disease) Stable.  Continue appropriate reflux lifestyle modifications and ranitidine 150 mg twice a day.   Return in about 4 months (around 02/10/2018), or if symptoms worsen or fail to improve.

## 2017-10-11 NOTE — Assessment & Plan Note (Signed)
Stable.  Continue appropriate reflux lifestyle modifications and ranitidine 150 mg twice a day. 

## 2017-10-11 NOTE — Progress Notes (Signed)
Follow-up Note  RE: Susan Lyons MRN: [id number] DOB: 10/13/1955 Date of Office Visit: 10/11/2017  Primary care provider: System, Pcp Not In Referring provider: No ref. provider found  History of present illness: Susan GuitarCarrol Lyons is a 62 y.o. female with persistent asthma, allergic rhinitis, and gastroesophageal reflux presenting today for follow-up.  She was last seen in this clinic in November 2018.  Reports that her asthma is well controlled with Xolair injections and Dulera 200-5 g, 2 inhalations via spacer device twice daily.  She reports that she had experienced persistent nasal congestion through December, January, and February because of all the rain and snow.  She has been using fluticasone nasal spray and nasal saline spray.  Acid reflux is relatively well controlled with ranitidine, though occasionally she will have to take a Tums in addition.  Assessment and plan: Severe persistent asthma Well-controlled.  Continue Xolair injections every 4 weeks and Dulera 200/5 g, 2 inhalations via spacer device twice a day.   During respiratory tract infections or asthma flares, add Flovent 110g 2 inhalations 2 times per day until symptoms have returned to baseline.  Subjective and objective measures of pulmonary function will be followed and the treatment plan will be adjusted accordingly.  Other allergic rhinitis  Continue appropriate allergen avoidance measures.  A prescription has been provided for azelastine nasal spray, 1-2 sprays per nostril 2 times daily as needed.   Continue fluticasone nasal spray, 2 sprays per nostril daily if needed.  Nasal saline spray (i.e., Simply Saline) or nasal saline lavage (i.e., NeilMed) is recommended as needed and prior to medicated nasal sprays.  GERD (gastroesophageal reflux disease) Stable.  Continue appropriate reflux lifestyle modifications and ranitidine 150 mg twice a day.   Meds ordered this encounter  Medications  .  mometasone-formoterol (DULERA) 200-5 MCG/ACT AERO    Sig: Inhale 2 puffs into the lungs 2 (two) times daily.    Dispense:  13 g    Refill:  2  . Azelastine HCl 0.15 % SOLN    Sig: Place 1-2 sprays into the nose 2 (two) times daily as needed.    Dispense:  30 mL    Refill:  3    Diagnostics: Spirometry:  Normal with an FEV1 of 80% predicted.  FEV1 is consistent with her previous study.  Please see scanned spirometry results for details.    Physical examination: Blood pressure 126/78, pulse 72, temperature (!) 97.5 F (36.4 C), temperature source Oral, resp. rate 16, height 5' (1.524 m), weight 154 lb 6.4 oz (70 kg), SpO2 95 %.  General: Alert, interactive, in no acute distress. HEENT: TMs pearly gray, turbinates mildly edematous without discharge, post-pharynx unremarkable. Neck: Supple without lymphadenopathy. Lungs: Clear to auscultation without wheezing, rhonchi or rales. CV: Normal S1, S2 without murmurs. Skin: Warm and dry, without lesions or rashes.  The following portions of the patient's history were reviewed and updated as appropriate: allergies, current medications, past family history, past medical history, past social history, past surgical history and problem list.  Allergies as of 10/11/2017      Reactions   Sulfa Antibiotics Rash, Hives   Avelox [moxifloxacin Hcl In Nacl] Nausea Only   Spiriva Handihaler [tiotropium Bromide Monohydrate] Swelling   Tiotropium Swelling      Medication List        Accurate as of 10/11/17 11:35 AM. Always use your most recent med list.          albuterol (2.5 MG/3ML) 0.083% nebulizer solution Commonly  known as:  PROVENTIL 2.5 mg every 6 (six) hours as needed for Wheezing or Shortness of Breath.   aspirin 81 MG tablet Take 81 mg by mouth daily.   Azelastine HCl 0.15 % Soln Place 1-2 sprays into the nose 2 (two) times daily as needed.   betamethasone valerate ointment 0.1 % Commonly known as:  VALISONE Apply 1 application  topically as needed.   COMBIVENT IN Inhale into the lungs.   EPINEPHrine 0.3 mg/0.3 mL Soaj injection Commonly known as:  EPIPEN 2-PAK USE AS DIRECTED FOR SEVERE ALLERGIC REACTION   fluticasone 110 MCG/ACT inhaler Commonly known as:  FLOVENT HFA Inhale 2 puffs into the lungs 2 (two) times daily as needed (for asthma flare).   fluticasone 50 MCG/ACT nasal spray Commonly known as:  FLONASE Place 1 spray 2 (two) times daily into both nostrils.   lisinopril-hydrochlorothiazide 10-12.5 MG tablet Commonly known as:  PRINZIDE,ZESTORETIC Take by mouth.   loratadine 10 MG tablet Commonly known as:  CLARITIN Take 10 mg by mouth daily.   mometasone-formoterol 200-5 MCG/ACT Aero Commonly known as:  DULERA Inhale 2 puffs into the lungs 2 (two) times daily.   montelukast 10 MG tablet Commonly known as:  SINGULAIR Take 1 tablet (10 mg total) by mouth at bedtime.   OSTEO BI-FLEX JOINT SHIELD Tabs Take by mouth.   ranitidine 150 MG tablet Commonly known as:  ZANTAC Take 150 mg by mouth daily.   triamcinolone cream 0.1 % Commonly known as:  KENALOG Apply 1 application topically as needed.   zoledronic acid 5 MG/100ML Soln injection Commonly known as:  RECLAST Inject 5 mg into the vein once.       Allergies  Allergen Reactions  . Sulfa Antibiotics Rash and Hives  . Avelox [Moxifloxacin Hcl In Nacl] Nausea Only  . Spiriva Handihaler [Tiotropium Bromide Monohydrate] Swelling  . Tiotropium Swelling   Review of systems: Review of systems negative except as noted in HPI / PMHx or noted below: Constitutional: Negative.  HENT: Negative.   Eyes: Negative.  Respiratory: Negative.   Cardiovascular: Negative.  Gastrointestinal: Negative.  Genitourinary: Negative.  Musculoskeletal: Negative.  Neurological: Negative.  Endo/Heme/Allergies: Negative.  Cutaneous: Negative.  Past Medical History:  Diagnosis Date  . Allergic rhinitis   . Asthma   . Hypertension   .  Osteoporosis     Family History  Problem Relation Age of Onset  . Lung cancer Mother   . Asthma Father   . Allergic rhinitis Neg Hx   . Angioedema Neg Hx   . Eczema Neg Hx   . Immunodeficiency Neg Hx   . Urticaria Neg Hx     Social History   Socioeconomic History  . Marital status: Married    Spouse name: Not on file  . Number of children: Not on file  . Years of education: Not on file  . Highest education level: Not on file  Occupational History  . Not on file  Social Needs  . Financial resource strain: Not on file  . Food insecurity:    Worry: Not on file    Inability: Not on file  . Transportation needs:    Medical: Not on file    Non-medical: Not on file  Tobacco Use  . Smoking status: Never Smoker  . Smokeless tobacco: Never Used  Substance and Sexual Activity  . Alcohol use: No  . Drug use: No  . Sexual activity: Not on file  Lifestyle  . Physical activity:    Days  per week: Not on file    Minutes per session: Not on file  . Stress: Not on file  Relationships  . Social connections:    Talks on phone: Not on file    Gets together: Not on file    Attends religious service: Not on file    Active member of club or organization: Not on file    Attends meetings of clubs or organizations: Not on file    Relationship status: Not on file  . Intimate partner violence:    Fear of current or ex partner: Not on file    Emotionally abused: Not on file    Physically abused: Not on file    Forced sexual activity: Not on file  Other Topics Concern  . Not on file  Social History Narrative  . Not on file    I appreciate the opportunity to take part in Susan Lyons's care. Please do not hesitate to contact me with questions.  Sincerely,   R. Jorene Guest, MD

## 2017-10-11 NOTE — Assessment & Plan Note (Signed)
Well-controlled.  Continue Xolair injections every 4 weeks and Dulera 200/5 g, 2 inhalations via spacer device twice a day.   During respiratory tract infections or asthma flares, add Flovent 110g 2 inhalations 2 times per day until symptoms have returned to baseline.  Subjective and objective measures of pulmonary function will be followed and the treatment plan will be adjusted accordingly. 

## 2017-10-17 ENCOUNTER — Ambulatory Visit: Payer: BLUE CROSS/BLUE SHIELD | Admitting: Allergy and Immunology

## 2017-11-09 ENCOUNTER — Ambulatory Visit: Payer: Self-pay

## 2017-11-23 ENCOUNTER — Other Ambulatory Visit: Payer: Self-pay | Admitting: Allergy and Immunology

## 2017-11-28 ENCOUNTER — Ambulatory Visit (INDEPENDENT_AMBULATORY_CARE_PROVIDER_SITE_OTHER): Payer: BLUE CROSS/BLUE SHIELD | Admitting: *Deleted

## 2017-11-28 DIAGNOSIS — J4540 Moderate persistent asthma, uncomplicated: Secondary | ICD-10-CM

## 2017-12-10 ENCOUNTER — Other Ambulatory Visit: Payer: Self-pay | Admitting: Allergy

## 2017-12-10 ENCOUNTER — Ambulatory Visit: Payer: BLUE CROSS/BLUE SHIELD | Admitting: Family Medicine

## 2017-12-10 ENCOUNTER — Encounter: Payer: Self-pay | Admitting: Family Medicine

## 2017-12-10 VITALS — BP 100/68 | HR 104 | Temp 98.1°F | Resp 20 | Ht 60.0 in | Wt 153.0 lb

## 2017-12-10 DIAGNOSIS — I10 Essential (primary) hypertension: Secondary | ICD-10-CM | POA: Diagnosis not present

## 2017-12-10 DIAGNOSIS — J3089 Other allergic rhinitis: Secondary | ICD-10-CM | POA: Diagnosis not present

## 2017-12-10 DIAGNOSIS — J4551 Severe persistent asthma with (acute) exacerbation: Secondary | ICD-10-CM

## 2017-12-10 DIAGNOSIS — K219 Gastro-esophageal reflux disease without esophagitis: Secondary | ICD-10-CM

## 2017-12-10 MED ORDER — IPRATROPIUM-ALBUTEROL 20-100 MCG/ACT IN AERS: INHALATION_SPRAY | RESPIRATORY_TRACT | 1 refills | Status: DC

## 2017-12-10 MED ORDER — ALBUTEROL SULFATE (2.5 MG/3ML) 0.083% IN NEBU: INHALATION_SOLUTION | RESPIRATORY_TRACT | 3 refills | Status: DC

## 2017-12-10 MED ORDER — MONTELUKAST SODIUM 10 MG PO TABS: 10.0000 mg | ORAL_TABLET | Freq: Every day | ORAL | 5 refills | Status: DC

## 2017-12-10 NOTE — Progress Notes (Signed)
[address] [place] [id number] Dept: [phone]  FOLLOW UP NOTE  Patient ID: Susan JourneyCarrol J Botto, female    DOB: 02/17/1956  Age: 62 y.o. MRN: [id number] Date of Office Visit: 12/10/2017  Assessment  Chief Complaint: Laryngitis (CHEST TIGHTNESS); Cough (SX X 5 DAYS); and Wheezing  HPI Susan Lyons is a 62 year old female who presents to the clinic for a sick visit. She was last seen in this clinic on 10/11/2017 by Dr Nunzio CobbsBobbitt for evaluation of asthma, allergic rhinitis, and reflux. At that time, she continued Xolair injections once a month, used Dulera 200- 2 puffs twice a day and albuterol as needed.  In the interim she reports having pneumonia and sinusitis in the first week of May for which she was treated by her primary care provider with Doxycycline and prednisone with resolution of symptoms. She did not have a chest xray at this time.  At today's visit, she reports that she spent all day outside on Tuesday and woke up on Wednesday with a sore throat, shortness of breath, wheezing, coughing with brown mucus, and increased throat clearing. She has tried some "home remedies" such as hot tea with honey, hot steam, salt water gargle, and ginger with no improvement in her symptoms.   Asthma is reported as mostly well controlled with the exception of the last 5 days. She continues on Xolair injections once a month Dulera 200- 2 puffs twice a day. She has started using Flovent 110- 2 puffs twice a day 4 days ago. She reports she has been out of albuterol inhaler as well as nebulizer solution for a few weeks.   Allergic rhinitis is reported as not well controlled with symptoms including clear nasal drainage, thick post nasal drainage, sore throat, and increased throat clearing. She is using Flonase 1 spray twice a day and over the counter loratadine once a day.  Reflux is reported as well controlled with no symptoms of heartburn or regurgitation. She takes ranitidine 150 once a day and  drinks decaffinated beverages.   Her current medications are listed in the chart.    Drug Allergies:  Allergies  Allergen Reactions  . Sulfa Antibiotics Rash and Hives  . Avelox [Moxifloxacin Hcl In Nacl] Nausea Only  . Spiriva Handihaler [Tiotropium Bromide Monohydrate] Swelling  . Tiotropium Swelling    Physical Exam: BP 100/68 (BP Location: Right Arm, Patient Position: Sitting, Cuff Size: Normal)   Pulse (!) 104   Temp 98.1 F (36.7 C) (Oral)   Resp 20   Ht 5' (1.524 m)   Wt 153 lb (69.4 kg)   SpO2 94%   BMI 29.88 kg/m    Physical Exam  Constitutional: She is oriented to person, place, and time. She appears well-developed and well-nourished.  HENT:  Head: Normocephalic.  Right Ear: External ear normal.  Left Ear: External ear normal.  Mouth/Throat: Oropharynx is clear and moist.  Bilateral nares erythematous and edematous with clear nasal drainage noted. Pharynx slightly erythematous with no exudate noted. Ears normal. Eyes normal.   Eyes: Conjunctivae are normal.  Neck: Normal range of motion. Neck supple.  Cardiovascular: Normal rate, regular rhythm and normal heart sounds.  No murmur noted.  Pulmonary/Chest:  Bilateral inspiratory and expiratory wheezes noted. Some scattered rhonchi. No change in lung sounds post bronchodilator therapy.   Musculoskeletal: Normal range of motion.  Neurological: She is alert and oriented to person, place, and time.  Skin: Skin is warm and dry.  Psychiatric: She has a normal  mood and affect. Her behavior is normal. Judgment and thought content normal.    Diagnostics: FVC 1.87, FEV1 1.48. Predicted FVC 2.73, predicted FEV1 2.09.  Spirometry indicates mild restriction. Post spirometry FVC 1.73, FEV1 1.44. No significant change post bronchodilator treatment.   Assessment and Plan: 1. Severe persistent asthma with acute exacerbation   2. Gastroesophageal reflux disease, esophagitis presence not specified   3. Other allergic  rhinitis   4. Essential hypertension     Meds ordered this encounter  Medications  . albuterol (PROVENTIL) (2.5 MG/3ML) 0.083% nebulizer solution    Sig: 2.5 mg every 6 (six) hours as needed for Wheezing or Shortness of Breath.    Dispense:  75 mL    Refill:  3  . montelukast (SINGULAIR) 10 MG tablet    Sig: Take 1 tablet (10 mg total) by mouth at bedtime.    Dispense:  30 tablet    Refill:  5  . Ipratropium-Albuterol (COMBIVENT) 20-100 MCG/ACT AERS respimat    Sig: Two puffs every 6 hours as needed for cough or wheeze.    Dispense:  4 g    Refill:  1    Patient Instructions  Continue Xolair injections every 4 weeks  Continue Dulera 200/5 g, 2 inhalations via spacer device twice a day to prevent cough or wheeze During respiratory tract infections or asthma flares, add Flovent 110g 2 inhalations 2 times per day until symptoms have returned to baseline. Continue ProAir 2 puffs every 4 hours as needed for cough or wheeze or instead albuterol 0.083% one unit dose via nebulizer every 4 hours as needed for cough or wheeze Continue montelukast 10 mg once a day to prevent cough or wheeze Prednisone 10 mg. Take 2 tablets twice a day for 3 days, then take 2 tablets once a day for 1 day, then take 1 tablet for 1 day, then stop. Continue azelastine nasal spray 2 sprays per nostril twice a day as needed for sneezing or a runny nose Continue Flonase nasal spray 1 spray in each nostril twice a day as needed for a stuffy nose Consider nasal saline rinses. Use this prior to medicated nasal sprays Continue ranitidine 150 mg twice a day to control reflux  Continue taking the other medications as listed in the chart  Call me if this treatment plan is not working well for you  Follow up in 2 months or sooner if needed    Return in about 2 months (around 02/09/2018), or if symptoms worsen or fail to improve.   Thank you for the opportunity to care for this patient.  Please do not hesitate to  contact me with questions.  Thermon Leyland, FNP [facility]  I have provided oversight concerning Thermon Leyland' evaluation and treatment of this patient's health issues addressed during today's encounter. I agree with the assessment and therapeutic plan as outlined in the note.   Thank you for the opportunity to care for this patient.  Please do not hesitate to contact me with questions.  Tonette Bihari, M.D.  [facility] [address] [place], [place] [address] [phone]

## 2017-12-10 NOTE — Patient Instructions (Addendum)
Continue Xolair injections every 4 weeks  Continue Dulera 200/5 g, 2 inhalations via spacer device twice a day to prevent cough or wheeze During respiratory tract infections or asthma flares, add Flovent 110g 2 inhalations 2 times per day until symptoms have returned to baseline. Continue ProAir 2 puffs every 4 hours as needed for cough or wheeze or instead albuterol 0.083% one unit dose via nebulizer every 4 hours as needed for cough or wheeze Continue montelukast 10 mg once a day to prevent cough or wheeze Prednisone 10 mg. Take 2 tablets twice a day for 3 days, then take 2 tablets once a day for 1 day, then take 1 tablet for 1 day, then stop. Continue azelastine nasal spray 2 sprays per nostril twice a day as needed for sneezing or a runny nose Continue Flonase nasal spray 1 spray in each nostril twice a day as needed for a stuffy nose Consider nasal saline rinses. Use this prior to medicated nasal sprays Continue ranitidine 150 mg twice a day to control reflux  Continue taking the other medications as listed in the chart  Call me if this treatment plan is not working well for you  Follow up in 2 months or sooner if needed

## 2017-12-26 ENCOUNTER — Ambulatory Visit: Payer: Self-pay

## 2018-01-01 ENCOUNTER — Other Ambulatory Visit: Payer: Self-pay | Admitting: Family Medicine

## 2018-01-30 ENCOUNTER — Ambulatory Visit (INDEPENDENT_AMBULATORY_CARE_PROVIDER_SITE_OTHER): Payer: BLUE CROSS/BLUE SHIELD

## 2018-01-30 DIAGNOSIS — J4551 Severe persistent asthma with (acute) exacerbation: Secondary | ICD-10-CM | POA: Diagnosis not present

## 2018-02-06 ENCOUNTER — Ambulatory Visit: Payer: Self-pay

## 2018-02-27 ENCOUNTER — Ambulatory Visit (INDEPENDENT_AMBULATORY_CARE_PROVIDER_SITE_OTHER): Payer: BLUE CROSS/BLUE SHIELD | Admitting: *Deleted

## 2018-02-27 DIAGNOSIS — J4540 Moderate persistent asthma, uncomplicated: Secondary | ICD-10-CM | POA: Diagnosis not present

## 2018-03-04 ENCOUNTER — Other Ambulatory Visit: Payer: Self-pay | Admitting: Allergy and Immunology

## 2018-03-04 DIAGNOSIS — J4550 Severe persistent asthma, uncomplicated: Secondary | ICD-10-CM

## 2018-03-27 ENCOUNTER — Ambulatory Visit (INDEPENDENT_AMBULATORY_CARE_PROVIDER_SITE_OTHER): Payer: BLUE CROSS/BLUE SHIELD | Admitting: *Deleted

## 2018-03-27 DIAGNOSIS — J4540 Moderate persistent asthma, uncomplicated: Secondary | ICD-10-CM

## 2018-04-24 ENCOUNTER — Ambulatory Visit (INDEPENDENT_AMBULATORY_CARE_PROVIDER_SITE_OTHER): Payer: BLUE CROSS/BLUE SHIELD | Admitting: *Deleted

## 2018-04-24 DIAGNOSIS — J4540 Moderate persistent asthma, uncomplicated: Secondary | ICD-10-CM | POA: Diagnosis not present

## 2018-05-22 ENCOUNTER — Ambulatory Visit (INDEPENDENT_AMBULATORY_CARE_PROVIDER_SITE_OTHER): Payer: BLUE CROSS/BLUE SHIELD

## 2018-05-22 DIAGNOSIS — J4540 Moderate persistent asthma, uncomplicated: Secondary | ICD-10-CM

## 2018-06-01 ENCOUNTER — Other Ambulatory Visit: Payer: Self-pay | Admitting: Allergy and Immunology

## 2018-06-01 DIAGNOSIS — J4550 Severe persistent asthma, uncomplicated: Secondary | ICD-10-CM

## 2018-06-19 ENCOUNTER — Ambulatory Visit (INDEPENDENT_AMBULATORY_CARE_PROVIDER_SITE_OTHER): Payer: BLUE CROSS/BLUE SHIELD | Admitting: *Deleted

## 2018-06-19 DIAGNOSIS — J4540 Moderate persistent asthma, uncomplicated: Secondary | ICD-10-CM | POA: Diagnosis not present

## 2018-06-20 ENCOUNTER — Ambulatory Visit: Payer: BLUE CROSS/BLUE SHIELD | Admitting: Allergy and Immunology

## 2018-06-20 ENCOUNTER — Encounter: Payer: Self-pay | Admitting: Family Medicine

## 2018-06-20 ENCOUNTER — Telehealth: Payer: Self-pay | Admitting: Family Medicine

## 2018-06-20 ENCOUNTER — Ambulatory Visit: Payer: BLUE CROSS/BLUE SHIELD | Admitting: Family Medicine

## 2018-06-20 VITALS — BP 112/68 | HR 78 | Temp 98.4°F | Resp 20 | Ht 60.0 in | Wt 148.8 lb

## 2018-06-20 DIAGNOSIS — J4551 Severe persistent asthma with (acute) exacerbation: Secondary | ICD-10-CM | POA: Diagnosis not present

## 2018-06-20 DIAGNOSIS — K219 Gastro-esophageal reflux disease without esophagitis: Secondary | ICD-10-CM

## 2018-06-20 DIAGNOSIS — J309 Allergic rhinitis, unspecified: Secondary | ICD-10-CM | POA: Diagnosis not present

## 2018-06-20 MED ORDER — FLUTICASONE PROPIONATE HFA 110 MCG/ACT IN AERO: INHALATION_SPRAY | RESPIRATORY_TRACT | 5 refills | Status: DC

## 2018-06-20 NOTE — Patient Instructions (Addendum)
Asthma Continue Xolair injections every 4 weeks  Continue Dulera 200/5 g, 2 inhalations via spacer device twice a day to prevent cough or wheeze During respiratory tract infections or asthma flares, add Flovent 110g 2 inhalations 2 times per day until symptoms have returned to baseline. Continue ProAir 2 puffs every 4 hours as needed for cough or wheeze or instead albuterol 0.083% one unit dose via nebulizer every 4 hours as needed for cough or wheeze Continue Combivent 2 puffs every 6 hours as needed Prednisone 10 mg. Take 2 tablets twice a day for 3 days, then take 2 tablets once a day for 1 day, then take 1 tablet for 1 day, then stop.  Allergic rhinitis Continue azelastine nasal spray 2 sprays per nostril twice a day as needed for sneezing or a runny nose Continue Flonase nasal spray 1 spray in each nostril twice a day as needed for a stuffy nose Consider nasal saline rinses. Use this prior to medicated nasal spray Begin Mucinex [phone] mg   Reflux Continue famotidine 20 mg once a day to control reflux. You may increase to 20 mg twice a day for breakthrough reflux symptoms  Continue taking the other medications as listed in the chart  Call me if this treatment plan is not working well for you  Follow up in 2 months or sooner if needed

## 2018-06-20 NOTE — Telephone Encounter (Signed)
Susan Battendvised [name] not to begin using Spiriva as it had previously caused swelling of her tongue. She verbalized understanding and will not begin using Spiriva.

## 2018-06-20 NOTE — Progress Notes (Signed)
[address] [place] [id number] Dept: [phone]  FOLLOW UP NOTE  Patient ID: Susan Lyons, female    DOB: 02/18/1956  Age: 62 y.o. MRN: [id number] Date of Office Visit: 06/20/2018  Assessment  Chief Complaint: Allergic Rhinitis  and Asthma  HPI Susan Lyons is a 62 year old female who presents to the clinic for a follow up visit. She was last seen in this clinic on 12/11/2107 by Susan LeylandAnne Feige Lowdermilk, NP for evaluation of asthma with exacerbation, allergic rhinitis, and reflux. At today's visit, she reports that her asthma has been moderately well controlled with some occasional shortness of breath and wheezing. She reports a cough that occurs especially when she is lying down. She continues with Xolair once a month, Dulera 200-2 puffs twice a day, and Combivent as needed. She has stopped taking montelukast as she reports it makes her feel jittery and ill. Allergic rhinitis is reported as not well controlled with nasal congestion when it rains and thick post nasal drip. She is using Flonase and saline nasal rinses as needed. Reflux is reported as well controlled woth famotidine 20 mg once a day. Her current medications are listed in the chart.    Drug Allergies:  Allergies  Allergen Reactions  . Sulfa Antibiotics Rash and Hives  . Avelox [Moxifloxacin Hcl In Nacl] Nausea Only  . Spiriva Handihaler [Tiotropium Bromide Monohydrate] Swelling  . Tiotropium Swelling    Physical Exam: BP 112/68 (BP Location: Left Arm, Patient Position: Sitting, Cuff Size: Normal)   Pulse 78   Temp 98.4 F (36.9 C) (Oral)   Resp 20   Ht 5' (1.524 m)   Wt 148 lb 13 oz (67.5 kg)   SpO2 96%   BMI 29.06 kg/m    Physical Exam Vitals signs reviewed.  Constitutional:      Appearance: Normal appearance.  HENT:     Head: Normocephalic.     Right Ear: Tympanic membrane normal.     Left Ear: Tympanic membrane normal.     Nose:     Comments: Bilateral nares erythematous with no nasal drainage noted.  Pharynx normal. Ears normal. Eyes normal.     Mouth/Throat:     Pharynx: Oropharynx is clear.  Eyes:     Conjunctiva/sclera: Conjunctivae normal.  Neck:     Musculoskeletal: Normal range of motion and neck supple.  Cardiovascular:     Rate and Rhythm: Normal rate and regular rhythm.     Heart sounds: Normal heart sounds.     Comments: No murmur noted Pulmonary:     Effort: Pulmonary effort is normal.     Breath sounds: Normal breath sounds.     Comments: Bilateral inspiratory scattered wheezing which improved slightly post bronchodilator therapy Musculoskeletal: Normal range of motion.  Skin:    General: Skin is warm and dry.  Neurological:     Mental Status: She is alert and oriented to person, place, and time.  Psychiatric:        Mood and Affect: Mood normal.        Behavior: Behavior normal.        Thought Content: Thought content normal.        Judgment: Judgment normal.     Diagnostics: FVC 2.02, FEV1 1.60. Predicted FVC 2.70, predicted FEV1 2.06. Post bronchodilator therapy FVC 2.09, FEV1 1.69. Post bronchodilator spirometry indicates mild restriction with 6% improvement in FEV1  Assessment and Plan: 1. Severe persistent asthma with acute exacerbation   2. Gastroesophageal reflux  disease, esophagitis presence not specified   3. Allergic rhinitis, unspecified seasonality, unspecified trigger     Meds ordered this encounter  Medications  . fluticasone (FLOVENT HFA) 110 MCG/ACT inhaler    Sig: 2 puffs twice daily to prevent coughing or wheezing.    Dispense:  1 Inhaler    Refill:  5    Please keep rx on file. Pt. Will call when needed.    Patient Instructions  Asthma Continue Xolair injections every 4 weeks  Continue Dulera 200/5 g, 2 inhalations via spacer device twice a day to prevent cough or wheeze During respiratory tract infections or asthma flares, add Flovent 110g 2 inhalations 2 times per day until symptoms have returned to baseline. Continue ProAir 2  puffs every 4 hours as needed for cough or wheeze or instead albuterol 0.083% one unit dose via nebulizer every 4 hours as needed for cough or wheeze Continue Combivent 2 puffs every 6 hours as needed Prednisone 10 mg. Take 2 tablets twice a day for 3 days, then take 2 tablets once a day for 1 day, then take 1 tablet for 1 day, then stop.  Allergic rhinitis Continue azelastine nasal spray 2 sprays per nostril twice a day as needed for sneezing or a runny nose Continue Flonase nasal spray 1 spray in each nostril twice a day as needed for a stuffy nose Consider nasal saline rinses. Use this prior to medicated nasal spray Begin Mucinex [phone] mg   Reflux Continue famotidine 20 mg once a day to control reflux. You may increase to 20 mg twice a day for breakthrough reflux symptoms  Continue taking the other medications as listed in the chart  Call me if this treatment plan is not working well for you  Follow up in 2 months or sooner if needed   Return in about 2 months (around 08/21/2018), or if symptoms worsen or fail to improve.    Thank you for the opportunity to care for this patient.  Please do not hesitate to contact me with questions.  Susan LeylandAnne Jameela Michna, FNP [facility]

## 2018-07-03 ENCOUNTER — Other Ambulatory Visit: Payer: Self-pay | Admitting: Allergy and Immunology

## 2018-07-03 DIAGNOSIS — J4550 Severe persistent asthma, uncomplicated: Secondary | ICD-10-CM

## 2018-07-17 ENCOUNTER — Ambulatory Visit (INDEPENDENT_AMBULATORY_CARE_PROVIDER_SITE_OTHER): Payer: BLUE CROSS/BLUE SHIELD

## 2018-07-17 DIAGNOSIS — J4540 Moderate persistent asthma, uncomplicated: Secondary | ICD-10-CM | POA: Diagnosis not present

## 2018-08-14 ENCOUNTER — Ambulatory Visit: Payer: BLUE CROSS/BLUE SHIELD

## 2018-08-16 ENCOUNTER — Ambulatory Visit (INDEPENDENT_AMBULATORY_CARE_PROVIDER_SITE_OTHER): Payer: BLUE CROSS/BLUE SHIELD | Admitting: *Deleted

## 2018-08-16 DIAGNOSIS — J4540 Moderate persistent asthma, uncomplicated: Secondary | ICD-10-CM

## 2018-08-21 ENCOUNTER — Ambulatory Visit: Payer: BLUE CROSS/BLUE SHIELD | Admitting: Family Medicine

## 2018-08-21 ENCOUNTER — Encounter: Payer: Self-pay | Admitting: Family Medicine

## 2018-08-21 VITALS — BP 120/68 | HR 67 | Temp 98.1°F | Resp 16

## 2018-08-21 DIAGNOSIS — J3089 Other allergic rhinitis: Secondary | ICD-10-CM | POA: Diagnosis not present

## 2018-08-21 DIAGNOSIS — K219 Gastro-esophageal reflux disease without esophagitis: Secondary | ICD-10-CM | POA: Diagnosis not present

## 2018-08-21 DIAGNOSIS — J4540 Moderate persistent asthma, uncomplicated: Secondary | ICD-10-CM | POA: Insufficient documentation

## 2018-08-21 MED ORDER — ALBUTEROL SULFATE HFA 108 (90 BASE) MCG/ACT IN AERS: 2.0000 | INHALATION_SPRAY | RESPIRATORY_TRACT | 3 refills | Status: DC | PRN

## 2018-08-21 MED ORDER — FAMOTIDINE 20 MG PO TABS: 20.0000 mg | ORAL_TABLET | Freq: Two times a day (BID) | ORAL | 5 refills | Status: DC

## 2018-08-21 NOTE — Patient Instructions (Addendum)
Asthma Continue Xolair injections every 4 weeks  Continue Dulera 200/5 g, 2 inhalations via spacer device twice a day to prevent cough or wheeze During respiratory tract infections or asthma flares, add Flovent 110g 2 inhalations 2 times per day until symptoms have returned to baseline. Continue ProAir 2 puffs every 4 hours as needed for cough or wheeze or instead albuterol 0.083% one unit dose via nebulizer every 4 hours as needed for cough or wheeze   Allergic rhinitis Continue azelastine nasal spray 2 sprays per nostril twice a day as needed for sneezing or a runny nose Continue Flonase nasal spray 1 spray in each nostril twice a day as needed for a stuffy nose Consider nasal saline rinses. Use this prior to medicated nasal spray Begin Mucinex [phone] mg   Reflux Increase famotidine 20 mg to twice a day to control reflux.   Continue taking the other medications as listed in the chart  Call me if this treatment plan is not working well for you  Follow up in 5 months or sooner if needed

## 2018-08-21 NOTE — Progress Notes (Addendum)
[address] [place] [address] Dept: [phone]  FOLLOW UP NOTE  Patient ID: Susan Lyons, female    DOB: Mar 25, 1956  Age: 63 y.o. MRN: [id number] Date of Office Visit: 08/21/2018  Assessment  Chief Complaint: Asthma  HPI Susan Lyons is a 63 year old female who presents to the clinic for a follow up visit. She was last seen in this clinic on 06/20/2018 by Thermon Leyland, NP for evaluation of asthma with acute exacerbation, allergic rhinitis, and reflux. At today's visit, she reports her asthma has been moderately well controlled with symptoms including occasional wheeze and continuous cough which produces white to clear thick mucus. She continues Dulera 200-2 puffs twice a day, Combivent Respimat about once a week, and Xolair 300 mg injection every 4 weeks. She has used Flovent 110-2 puffs twice a day for 2 asthma flares over the last year. Allergic rhinitis is reported as moderately well controlled with  Flonase as needed and loratadine once a day. Reflux is reported as moderately well controlled with famotidine 20 mg once a day. She reports foods such as peanut butter, chocolate, and spicy foods aggravate her reflux. Her current medications are listed in the chart.   Drug Allergies:  Allergies  Allergen Reactions  . Sulfa Antibiotics Rash and Hives  . Avelox [Moxifloxacin Hcl In Nacl] Nausea Only  . Spiriva Handihaler [Tiotropium Bromide Monohydrate] Swelling  . Tiotropium Swelling    Physical Exam: BP 120/68   Pulse 67   Temp 98.1 F (36.7 C) (Oral)   Resp 16   SpO2 97%    Physical Exam Vitals signs reviewed.  Constitutional:      Appearance: Normal appearance.  HENT:     Head: Normocephalic and atraumatic.     Right Ear: Tympanic membrane normal.     Left Ear: Tympanic membrane normal.     Nose:     Comments: Bilateral nares slightly erythematous with clear nasal drainage noted. Pharynx slightly erythematous with no exudate. Ears normal. Eyes  normal. Eyes:     Conjunctiva/sclera: Conjunctivae normal.  Neck:     Musculoskeletal: Normal range of motion and neck supple.  Cardiovascular:     Rate and Rhythm: Normal rate and regular rhythm.     Heart sounds: Normal heart sounds. No murmur.  Pulmonary:     Effort: Pulmonary effort is normal.     Breath sounds: Normal breath sounds.     Comments: Bilateral expiratory scattered wheeze Musculoskeletal: Normal range of motion.  Skin:    General: Skin is warm and dry.  Neurological:     Mental Status: She is alert and oriented to person, place, and time.  Psychiatric:        Mood and Affect: Mood normal.        Behavior: Behavior normal.        Thought Content: Thought content normal.        Judgment: Judgment normal.     Diagnostics: FVC 2.01, FEV1 1.59. Predicted FVC 2.70, predicted FEV1 2.06. Spirometry indicates mild restriction. This is consistent with previous readings. Patient refused bronchodilator treatment in the clinic today.   Assessment and Plan: 1. Moderate persistent asthma, uncomplicated   2. Other allergic rhinitis   3. Gastroesophageal reflux disease, esophagitis presence not specified     Meds ordered this encounter  Medications  . albuterol (PROAIR HFA) 108 (90 Base) MCG/ACT inhaler    Sig: Inhale 2 puffs into the lungs every 4 (four) hours as needed for  wheezing or shortness of breath.    Dispense:  1 Inhaler    Refill:  3  . famotidine (PEPCID) 20 MG tablet    Sig: Take 1 tablet (20 mg total) by mouth 2 (two) times daily.    Dispense:  60 tablet    Refill:  5    Patient Instructions  Asthma Continue Xolair injections every 4 weeks  Continue Dulera 200/5 g, 2 inhalations via spacer device twice a day to prevent cough or wheeze During respiratory tract infections or asthma flares, add Flovent 110g 2 inhalations 2 times per day until symptoms have returned to baseline. Continue ProAir 2 puffs every 4 hours as needed for cough or wheeze or  instead albuterol 0.083% one unit dose via nebulizer every 4 hours as needed for cough or wheeze   Allergic rhinitis Continue azelastine nasal spray 2 sprays per nostril twice a day as needed for sneezing or a runny nose Continue Flonase nasal spray 1 spray in each nostril twice a day as needed for a stuffy nose Consider nasal saline rinses. Use this prior to medicated nasal spray Begin Mucinex [phone] mg   Reflux Increase famotidine 20 mg to twice a day to control reflux.   Continue taking the other medications as listed in the chart  Call me if this treatment plan is not working well for you  Follow up in 5 months or sooner if needed   Return in about 5 months (around 01/19/2019), or if symptoms worsen or fail to improve.    Thank you for the opportunity to care for this patient.  Please do not hesitate to contact me with questions.  Thermon Leyland, FNP [facility]  _________________________________________________  I have provided oversight concerning [facility]'s evaluation and treatment of this patient's health issues addressed during today's encounter.  I agree with the assessment and therapeutic plan as outlined in the note.   Signed,   R Jorene Guest, MD

## 2018-09-01 ENCOUNTER — Other Ambulatory Visit: Payer: Self-pay | Admitting: Allergy and Immunology

## 2018-09-01 DIAGNOSIS — J4550 Severe persistent asthma, uncomplicated: Secondary | ICD-10-CM

## 2018-09-16 ENCOUNTER — Ambulatory Visit: Payer: Self-pay

## 2018-09-18 ENCOUNTER — Other Ambulatory Visit: Payer: Self-pay

## 2018-09-18 ENCOUNTER — Ambulatory Visit (INDEPENDENT_AMBULATORY_CARE_PROVIDER_SITE_OTHER): Payer: BLUE CROSS/BLUE SHIELD

## 2018-09-18 DIAGNOSIS — J4540 Moderate persistent asthma, uncomplicated: Secondary | ICD-10-CM | POA: Diagnosis not present

## 2018-10-16 ENCOUNTER — Ambulatory Visit: Payer: Self-pay

## 2018-10-17 ENCOUNTER — Other Ambulatory Visit: Payer: Self-pay

## 2018-10-17 ENCOUNTER — Ambulatory Visit (INDEPENDENT_AMBULATORY_CARE_PROVIDER_SITE_OTHER): Payer: BLUE CROSS/BLUE SHIELD

## 2018-10-17 DIAGNOSIS — J4540 Moderate persistent asthma, uncomplicated: Secondary | ICD-10-CM

## 2018-11-14 ENCOUNTER — Other Ambulatory Visit: Payer: Self-pay

## 2018-11-14 ENCOUNTER — Ambulatory Visit (INDEPENDENT_AMBULATORY_CARE_PROVIDER_SITE_OTHER): Payer: BLUE CROSS/BLUE SHIELD

## 2018-11-14 DIAGNOSIS — J4540 Moderate persistent asthma, uncomplicated: Secondary | ICD-10-CM | POA: Diagnosis not present

## 2018-12-18 ENCOUNTER — Ambulatory Visit (INDEPENDENT_AMBULATORY_CARE_PROVIDER_SITE_OTHER): Payer: BC Managed Care – PPO

## 2018-12-18 ENCOUNTER — Other Ambulatory Visit: Payer: Self-pay

## 2018-12-18 DIAGNOSIS — J4540 Moderate persistent asthma, uncomplicated: Secondary | ICD-10-CM

## 2019-01-01 ENCOUNTER — Other Ambulatory Visit: Payer: Self-pay | Admitting: Allergy and Immunology

## 2019-01-01 DIAGNOSIS — J4550 Severe persistent asthma, uncomplicated: Secondary | ICD-10-CM

## 2019-01-15 ENCOUNTER — Ambulatory Visit (INDEPENDENT_AMBULATORY_CARE_PROVIDER_SITE_OTHER): Payer: BC Managed Care – PPO

## 2019-01-15 ENCOUNTER — Other Ambulatory Visit: Payer: Self-pay

## 2019-01-15 DIAGNOSIS — J4540 Moderate persistent asthma, uncomplicated: Secondary | ICD-10-CM | POA: Diagnosis not present

## 2019-01-22 ENCOUNTER — Encounter: Payer: Self-pay | Admitting: Allergy and Immunology

## 2019-01-22 ENCOUNTER — Ambulatory Visit: Payer: BLUE CROSS/BLUE SHIELD | Admitting: Allergy and Immunology

## 2019-01-22 ENCOUNTER — Other Ambulatory Visit: Payer: Self-pay

## 2019-01-22 VITALS — BP 132/80 | HR 82 | Temp 97.7°F | Resp 18

## 2019-01-22 DIAGNOSIS — J4540 Moderate persistent asthma, uncomplicated: Secondary | ICD-10-CM

## 2019-01-22 DIAGNOSIS — J3089 Other allergic rhinitis: Secondary | ICD-10-CM | POA: Diagnosis not present

## 2019-01-22 DIAGNOSIS — K219 Gastro-esophageal reflux disease without esophagitis: Secondary | ICD-10-CM

## 2019-01-22 MED ORDER — ALBUTEROL SULFATE HFA 108 (90 BASE) MCG/ACT IN AERS: 1.0000 | INHALATION_SPRAY | RESPIRATORY_TRACT | 3 refills | Status: DC | PRN

## 2019-01-22 NOTE — Assessment & Plan Note (Addendum)
Stable.    Continue Xolair injections every 4 weeks and Dulera 200/5 g, 2 inhalations via spacer device twice a day.   Based upon expense, when she switches to Medicare in August she will start a less expensive inhaler.  During respiratory tract infections or asthma flares, add Flovent 110g 2 inhalations 2 times per day until symptoms have returned to baseline.  Subjective and objective measures of pulmonary function will be followed and the treatment plan will be adjusted accordingly.

## 2019-01-22 NOTE — Progress Notes (Signed)
Follow-up Note  RE: Susan Lyons MRN: [id number] DOB: 1955/12/27 Date of Office Visit: 01/22/2019  Primary care provider: System, Pcp Not In Referring provider: No ref. provider found  History of present illness: Susan Lyons is a 63 y.o. female with persistent asthma, allergic rhinitis, and gastroesophageal reflux presenting today for follow-up.  She was previously seen in this clinic on August 21, 2018.  She reports that she gets "winded" when she is walking for exercise with her husband, however she rarely requires albuterol rescue and does not experience limitations in normal daily activities, nocturnal awakenings due to lower respiratory symptoms.  She admits that she has not been using albuterol prior to exercise.  She is currently taking Dulera 200-5 micro grams, 2 inhalations via spacer device twice daily. However she states that when she switches from her present insurance to [facility] in August she will have to pay $200 per month for this medication and therefore would like to switch to a different inhaler at that time.  Susan Lyons reports that her nasal allergy symptoms are well controlled with fluticasone nasal spray as needed.  She takes famotidine 20 mg daily to control reflux.  She reports that she occasionally requires Tums, particularly if she eats peanut butter or chocolate.  Assessment and plan: Moderate persistent asthma Stable.    Continue Xolair injections every 4 weeks and Dulera 200/5 g, 2 inhalations via spacer device twice a day.   Based upon expense, when she switches to Medicare in August she will start a less expensive inhaler.  During respiratory tract infections or asthma flares, add Flovent 110g 2 inhalations 2 times per day until symptoms have returned to baseline.  Subjective and objective measures of pulmonary function will be followed and the treatment plan will be adjusted accordingly.  Other allergic rhinitis  Continue appropriate allergen  avoidance measures, azelastine nasal spray, 1-2 sprays per nostril 2 times daily as needed, and/or fluticasone nasal spray, 2 sprays per nostril daily if needed.  Nasal saline spray (i.e., Simply Saline) or nasal saline lavage (i.e., NeilMed) is recommended as needed and prior to medicated nasal sprays.  Gastroesophageal reflux disease  Continue appropriate reflux lifestyle modifications and famotidine 20 mg daily.   Diagnostics: Spirometry:  Normal with an FEV1 of 81% predicted with an FEV1 ratio of 106%.  Please see scanned spirometry results for details.    Physical examination: Blood pressure 132/80, pulse 82, temperature 97.7 F (36.5 C), temperature source Temporal, resp. rate 18, SpO2 95 %.  General: Alert, interactive, in no acute distress. HEENT: TMs pearly gray, turbinates mildly edematous without discharge, post-pharynx unremarkable. Neck: Supple without lymphadenopathy. Lungs: Clear to auscultation without wheezing, rhonchi or rales. CV: Normal S1, S2 without murmurs. Skin: Warm and dry, without lesions or rashes.  The following portions of the patient's history were reviewed and updated as appropriate: allergies, current medications, past family history, past medical history, past social history, past surgical history and problem list.  Allergies as of 01/22/2019      Reactions   Sulfa Antibiotics Rash, Hives   Avelox [moxifloxacin Hcl In Nacl] Nausea Only   Spiriva Handihaler [tiotropium Bromide Monohydrate] Swelling   Tiotropium Swelling      Medication List       Accurate as of January 22, 2019  3:29 PM. If you have any questions, ask your nurse or doctor.        STOP taking these medications   Combivent Respimat 20-100 MCG/ACT Aers respimat Generic drug: Ipratropium-Albuterol Stopped  by: Wellington Hampshire Carter Alliene Klugh, MD     TAKE these medications   albuterol 108 (90 Base) MCG/ACT inhaler Commonly known as: ProAir HFA Inhale 1-2 puffs into the lungs every 4 (four)  hours as needed for wheezing or shortness of breath. What changed: how much to take Changed by: Wellington Hampshire Carter Chrystian Ressler, MD   aspirin 81 MG tablet Take 81 mg by mouth every Monday, Wednesday, and Friday.   Azelastine HCl 0.15 % Soln Place 1-2 sprays into the nose 2 (two) times daily as needed.   betamethasone valerate ointment 0.1 % Commonly known as: VALISONE Apply 1 application topically as needed.   Dulera 200-5 MCG/ACT Aero Generic drug: mometasone-formoterol TAKE 2 PUFFS BY MOUTH TWICE A DAY   EPINEPHrine 0.3 mg/0.3 mL Soaj injection Commonly known as: EpiPen 2-Pak USE AS DIRECTED FOR SEVERE ALLERGIC REACTION   famotidine 20 MG tablet Commonly known as: PEPCID Take 1 tablet (20 mg total) by mouth 2 (two) times daily.   fluticasone 110 MCG/ACT inhaler Commonly known as: FLOVENT HFA 2 puffs twice daily to prevent coughing or wheezing.   fluticasone 50 MCG/ACT nasal spray Commonly known as: Flonase Place 1 spray 2 (two) times daily into both nostrils.   lisinopril-hydrochlorothiazide 10-12.5 MG tablet Commonly known as: ZESTORETIC Take by mouth.   loratadine 10 MG tablet Commonly known as: CLARITIN Take 10 mg by mouth daily.   montelukast 10 MG tablet Commonly known as: SINGULAIR Take 1 tablet (10 mg total) by mouth at bedtime.   Osteo Bi-Flex Joint Shield Tabs Take by mouth.   triamcinolone cream 0.1 % Commonly known as: KENALOG Apply 1 application topically as needed.   Xolair 150 MG injection Generic drug: omalizumab INJECT 300 MG SUBCUTANEOUSLY EVERY 4 WEEKS.   zoledronic acid 5 MG/100ML Soln injection Commonly known as: RECLAST Inject 5 mg into the vein once. Once a year       Allergies  Allergen Reactions  . Sulfa Antibiotics Rash and Hives  . Avelox [Moxifloxacin Hcl In Nacl] Nausea Only  . Spiriva Handihaler [Tiotropium Bromide Monohydrate] Swelling  . Tiotropium Swelling   Review of systems: Review of systems negative except as noted in HPI  / PMHx or noted below: Constitutional: Negative.  HENT: Negative.   Eyes: Negative.  Respiratory: Negative.   Cardiovascular: Negative.  Gastrointestinal: Negative.  Genitourinary: Negative.  Musculoskeletal: Negative.  Neurological: Negative.  Endo/Heme/Allergies: Negative.  Cutaneous: Negative.  Past Medical History:  Diagnosis Date  . Allergic rhinitis   . Asthma   . Hypertension   . Osteoporosis     Family History  Problem Relation Age of Onset  . Lung cancer Mother   . Asthma Father   . Allergic rhinitis Neg Hx   . Angioedema Neg Hx   . Eczema Neg Hx   . Immunodeficiency Neg Hx   . Urticaria Neg Hx     Social History   Socioeconomic History  . Marital status: Married    Spouse name: Not on file  . Number of children: Not on file  . Years of education: Not on file  . Highest education level: Not on file  Occupational History  . Not on file  Social Needs  . Financial resource strain: Not on file  . Food insecurity    Worry: Not on file    Inability: Not on file  . Transportation needs    Medical: Not on file    Non-medical: Not on file  Tobacco Use  . Smoking status: Never Smoker  .  Smokeless tobacco: Never Used  Substance and Sexual Activity  . Alcohol use: No  . Drug use: No  . Sexual activity: Not on file  Lifestyle  . Physical activity    Days per week: Not on file    Minutes per session: Not on file  . Stress: Not on file  Relationships  . Social Musicianconnections    Talks on phone: Not on file    Gets together: Not on file    Attends religious service: Not on file    Active member of club or organization: Not on file    Attends meetings of clubs or organizations: Not on file    Relationship status: Not on file  . Intimate partner violence    Fear of current or ex partner: Not on file    Emotionally abused: Not on file    Physically abused: Not on file    Forced sexual activity: Not on file  Other Topics Concern  . Not on file  Social  History Narrative  . Not on file    I appreciate the opportunity to take part in Izzabell's care. Please do not hesitate to contact me with questions.  Sincerely,   R. Jorene Guestarter Shanard Treto, MD

## 2019-01-22 NOTE — Assessment & Plan Note (Signed)
   Continue appropriate allergen avoidance measures, azelastine nasal spray, 1-2 sprays per nostril 2 times daily as needed, and/or fluticasone nasal spray, 2 sprays per nostril daily if needed.  Nasal saline spray (i.e., Simply Saline) or nasal saline lavage (i.e., NeilMed) is recommended as needed and prior to medicated nasal sprays.

## 2019-01-22 NOTE — Patient Instructions (Addendum)
Moderate persistent asthma Stable.    Continue Xolair injections every 4 weeks and Dulera 200/5 g, 2 inhalations via spacer device twice a day.   Based upon expense, when she switches to Medicare in August she will start a less expensive inhaler.  During respiratory tract infections or asthma flares, add Flovent 110g 2 inhalations 2 times per day until symptoms have returned to baseline.  Subjective and objective measures of pulmonary function will be followed and the treatment plan will be adjusted accordingly.  Other allergic rhinitis  Continue appropriate allergen avoidance measures, azelastine nasal spray, 1-2 sprays per nostril 2 times daily as needed, and/or fluticasone nasal spray, 2 sprays per nostril daily if needed.  Nasal saline spray (i.e., Simply Saline) or nasal saline lavage (i.e., NeilMed) is recommended as needed and prior to medicated nasal sprays.  Gastroesophageal reflux disease  Continue appropriate reflux lifestyle modifications and famotidine 20 mg daily.   Return in about 4 months (around 05/25/2019), or if symptoms worsen or fail to improve.

## 2019-01-22 NOTE — Assessment & Plan Note (Addendum)
   Continue appropriate reflux lifestyle modifications and famotidine 20 mg daily. 

## 2019-02-11 DIAGNOSIS — J4540 Moderate persistent asthma, uncomplicated: Secondary | ICD-10-CM | POA: Diagnosis not present

## 2019-02-12 ENCOUNTER — Other Ambulatory Visit: Payer: Self-pay

## 2019-02-12 ENCOUNTER — Ambulatory Visit (INDEPENDENT_AMBULATORY_CARE_PROVIDER_SITE_OTHER): Payer: Medicare Other

## 2019-02-12 DIAGNOSIS — J4540 Moderate persistent asthma, uncomplicated: Secondary | ICD-10-CM | POA: Diagnosis not present

## 2019-03-11 DIAGNOSIS — J4540 Moderate persistent asthma, uncomplicated: Secondary | ICD-10-CM | POA: Diagnosis not present

## 2019-03-12 ENCOUNTER — Ambulatory Visit (INDEPENDENT_AMBULATORY_CARE_PROVIDER_SITE_OTHER): Payer: Medicare Other

## 2019-03-12 ENCOUNTER — Other Ambulatory Visit: Payer: Self-pay

## 2019-03-12 ENCOUNTER — Telehealth: Payer: Self-pay | Admitting: *Deleted

## 2019-03-12 DIAGNOSIS — J4540 Moderate persistent asthma, uncomplicated: Secondary | ICD-10-CM

## 2019-03-12 MED ORDER — ADVAIR HFA 230-21 MCG/ACT IN AERO: 2.0000 | INHALATION_SPRAY | Freq: Two times a day (BID) | RESPIRATORY_TRACT | 5 refills | Status: DC

## 2019-03-12 NOTE — Telephone Encounter (Signed)
Advair HFA 230-21 g, 2 inhalations via spacer device twice daily. Thanks. 

## 2019-03-12 NOTE — Telephone Encounter (Signed)
Klover came in for her shot and she stated her Susan Lyons is too high. I checked with the pharmacy she has $141 copay. Susan Lyons is the highest tier on her formulary tier 4. Tier 3 includes breo, advair hfa, advair diskus, arnuity, anoro. Please advise.

## 2019-03-12 NOTE — Telephone Encounter (Signed)
Checked with pharmacy and the advair will be $47.

## 2019-03-12 NOTE — Telephone Encounter (Signed)
Sent advair through- pharmacy reopens at 2 and I will call then to check pricing

## 2019-03-25 ENCOUNTER — Telehealth: Payer: Self-pay

## 2019-03-25 NOTE — Telephone Encounter (Signed)
Patient called wanting to talk with Tammy.  I told her Lynelle Smoke is currently out of the office for the week but I can take a message.  She recently turned 84 and got Medicare and has since received a bill from [facility] for $198 for her Xolair injection.  Please advise.  Is this a correct charge? Will patient have to continue to pay for each Xolair injection? Patient aware that Tammy is out of the office this week.

## 2019-04-02 NOTE — Telephone Encounter (Signed)
L/M for patient advising she does have a $200 yearly deductible with her [employer] and was applied to her injections in Aug. Advised if she had any other questions she can reach back out to me

## 2019-04-09 ENCOUNTER — Ambulatory Visit: Payer: Self-pay

## 2019-04-21 DIAGNOSIS — J4540 Moderate persistent asthma, uncomplicated: Secondary | ICD-10-CM

## 2019-04-22 ENCOUNTER — Ambulatory Visit: Payer: Medicare Other

## 2019-04-22 ENCOUNTER — Ambulatory Visit (INDEPENDENT_AMBULATORY_CARE_PROVIDER_SITE_OTHER): Payer: Medicare Other

## 2019-04-22 ENCOUNTER — Other Ambulatory Visit: Payer: Self-pay

## 2019-04-22 DIAGNOSIS — J4540 Moderate persistent asthma, uncomplicated: Secondary | ICD-10-CM

## 2019-05-19 DIAGNOSIS — J4540 Moderate persistent asthma, uncomplicated: Secondary | ICD-10-CM | POA: Diagnosis not present

## 2019-05-20 ENCOUNTER — Ambulatory Visit (INDEPENDENT_AMBULATORY_CARE_PROVIDER_SITE_OTHER): Payer: Medicare Other

## 2019-05-20 ENCOUNTER — Other Ambulatory Visit: Payer: Self-pay

## 2019-05-20 DIAGNOSIS — J4540 Moderate persistent asthma, uncomplicated: Secondary | ICD-10-CM | POA: Diagnosis not present

## 2019-05-28 ENCOUNTER — Ambulatory Visit (INDEPENDENT_AMBULATORY_CARE_PROVIDER_SITE_OTHER): Payer: Medicare Other | Admitting: Allergy and Immunology

## 2019-05-28 ENCOUNTER — Other Ambulatory Visit: Payer: Self-pay

## 2019-05-28 ENCOUNTER — Encounter: Payer: Self-pay | Admitting: Allergy and Immunology

## 2019-05-28 ENCOUNTER — Telehealth: Payer: Self-pay | Admitting: *Deleted

## 2019-05-28 VITALS — BP 130/76 | HR 76 | Temp 98.4°F | Resp 20 | Ht 60.0 in | Wt 157.6 lb

## 2019-05-28 DIAGNOSIS — K219 Gastro-esophageal reflux disease without esophagitis: Secondary | ICD-10-CM

## 2019-05-28 DIAGNOSIS — J4540 Moderate persistent asthma, uncomplicated: Secondary | ICD-10-CM

## 2019-05-28 DIAGNOSIS — J3089 Other allergic rhinitis: Secondary | ICD-10-CM | POA: Diagnosis not present

## 2019-05-28 MED ORDER — AZELASTINE & FLUTICASONE 137 & 50 MCG/ACT NA THPK: 1.0000 | PACK | Freq: Two times a day (BID) | NASAL | 5 refills | Status: DC | PRN

## 2019-05-28 MED ORDER — AZELASTINE HCL 0.1 % NA SOLN: 1.0000 | Freq: Two times a day (BID) | NASAL | 5 refills | Status: DC

## 2019-05-28 MED ORDER — FLOVENT HFA 110 MCG/ACT IN AERO: INHALATION_SPRAY | RESPIRATORY_TRACT | 5 refills | Status: DC

## 2019-05-28 MED ORDER — FLUTICASONE PROPIONATE 50 MCG/ACT NA SUSP: 2.0000 | Freq: Every day | NASAL | 5 refills | Status: DC

## 2019-05-28 NOTE — Assessment & Plan Note (Signed)
   Continue appropriate reflux lifestyle modifications and famotidine 20 mg daily.  Add Tums, if needed.

## 2019-05-28 NOTE — Telephone Encounter (Signed)
Received pa request for generic dymista. Sent in the split dose separately.

## 2019-05-28 NOTE — Assessment & Plan Note (Signed)
Stable.    Continue Xolair injections every 4 weeks and Advair 230/21 g, 2 inhalations via spacer device twice a day.   During respiratory tract infections or asthma flares, add Flovent 110g 2 inhalations 2 times per day until symptoms have returned to baseline.  Subjective and objective measures of pulmonary function will be followed and the treatment plan will be adjusted accordingly.

## 2019-05-28 NOTE — Progress Notes (Signed)
Follow-up Note  RE: Susan Lyons MRN: [id number] DOB: 02/14/1956 Date of Office Visit: 05/28/2019  Primary care provider: System, Pcp Not In Referring provider: No ref. provider found  History of present illness: Susan Lyons is a 63 y.o. female with persistent asthma, allergic rhinitis, and esophageal reflux presenting today for follow-up.  She was last seen in this clinic on January 22, 2019.  For asthma control she is currently taking Xolair injections and Advair 230/21 g, 2 inhalations via spacer device twice daily.  While on this regimen she requires albuterol rescue every 3 weeks on average.  She does not experience limitations in normal daily activities or nocturnal awakenings due to lower respiratory symptoms.  She reports that she has been experiencing some thick postnasal drainage and throat clearing.  In addition, she experiences occasional left nostril congestion and occasionally when bending forward notices "an odor" best described as "just a weird smell."  She currently attempts to control her nasal allergy symptoms with fluticasone nasal spray daily and nasal sinus rinse. Her reflux is controlled with famotidine 20 mg in the morning and, if needed, Tums in the evening.  Assessment and plan: Moderate persistent asthma Stable.    Continue Xolair injections every 4 weeks and Advair 230/21 g, 2 inhalations via spacer device twice a day.   During respiratory tract infections or asthma flares, add Flovent 110g 2 inhalations 2 times per day until symptoms have returned to baseline.  Subjective and objective measures of pulmonary function will be followed and the treatment plan will be adjusted accordingly.  Other allergic rhinitis  Continue appropriate allergen avoidance measures and nasal saline lavage.  A prescription has been provided for azelastine/fluticasone nasal spray, 1 spray per nostril twice daily as needed.   For thick post nasal drainage, add guaifenesin  [phone] mg (Mucinex)  twice daily as needed with adequate hydration as discussed.  Gastroesophageal reflux disease  Continue appropriate reflux lifestyle modifications and famotidine 20 mg daily.  Add Tums, if needed.   Meds ordered this encounter  Medications  . fluticasone (FLOVENT HFA) 110 MCG/ACT inhaler    Sig: Two puffs with spacer twice a day during respiratory flares    Dispense:  1 Inhaler    Refill:  5  . Azelastine & Fluticasone 137 & 50 MCG/ACT THPK    Sig: Place 1 spray into the nose 2 (two) times daily as needed.    Dispense:  1 each    Refill:  5    Diagnostics: Spirometry:  Normal with an FEV1 of 80% predicted.  Please see scanned spirometry results for details.    Physical examination: Blood pressure 130/76, pulse 76, temperature 98.4 F (36.9 C), temperature source Oral, resp. rate 20, height 5' (1.524 m), weight 157 lb 10.1 oz (71.5 kg), SpO2 97 %.  General: Alert, interactive, in no acute distress. HEENT: TMs pearly gray, turbinates mildly edematous without discharge, post-pharynx mildly erythematous. Neck: Supple without lymphadenopathy. Lungs: Clear to auscultation without wheezing, rhonchi or rales. CV: Normal S1, S2 without murmurs. Skin: Warm and dry, without lesions or rashes.  The following portions of the patient's history were reviewed and updated as appropriate: allergies, current medications, past family history, past medical history, past social history, past surgical history and problem list.  Current Outpatient Medications  Medication Sig Dispense Refill  . albuterol (PROAIR HFA) 108 (90 Base) MCG/ACT inhaler Inhale 1-2 puffs into the lungs every 4 (four) hours as needed for wheezing or shortness of breath. 6.7  g 3  . albuterol (PROVENTIL) (2.5 MG/3ML) 0.083% nebulizer solution INHALE CONTENT OF 1 VIAL BY NEBULIZATION EVERY 6 HOURS AS NEEDED FOR WHEEZING OR SHORTNESS OF BREATH    . aspirin 81 MG tablet Take 81 mg by mouth every Monday,  Wednesday, and Friday.     . betamethasone valerate ointment (VALISONE) 0.1 % Apply 1 application topically as needed.    Marland Kitchen EPINEPHrine (EPIPEN 2-PAK) 0.3 mg/0.3 mL IJ SOAJ injection USE AS DIRECTED FOR SEVERE ALLERGIC REACTION 1 Device 2  . famotidine (PEPCID) 20 MG tablet Take 1 tablet (20 mg total) by mouth 2 (two) times daily. 60 tablet 5  . fluticasone (FLONASE) 50 MCG/ACT nasal spray Place 1 spray 2 (two) times daily into both nostrils. 16 g 5  . fluticasone-salmeterol (ADVAIR HFA) 230-21 MCG/ACT inhaler Inhale 2 puffs into the lungs 2 (two) times daily. 1 Inhaler 5  . lisinopril-hydrochlorothiazide (PRINZIDE,ZESTORETIC) 10-12.5 MG tablet Take by mouth.    . loratadine (CLARITIN) 10 MG tablet Take 10 mg by mouth daily.    . Misc Natural Products (OSTEO BI-FLEX JOINT SHIELD) TABS Take by mouth.    . triamcinolone cream (KENALOG) 0.1 % Apply 1 application topically as needed.    Geoffry Paradise 150 MG injection INJECT 300 MG SUBCUTANEOUSLY EVERY 4 WEEKS. 2 each 10  . zoledronic acid (RECLAST) 5 MG/100ML SOLN injection Inject 5 mg into the vein once. Once a year    . Azelastine & Fluticasone 137 & 50 MCG/ACT THPK Place 1 spray into the nose 2 (two) times daily as needed. 1 each 5  . fluticasone (FLOVENT HFA) 110 MCG/ACT inhaler Two puffs with spacer twice a day during respiratory flares 1 Inhaler 5  . montelukast (SINGULAIR) 10 MG tablet Take 1 tablet (10 mg total) by mouth at bedtime. (Patient not taking: Reported on 05/28/2019) 30 tablet 5   Current Facility-Administered Medications  Medication Dose Route Frequency Provider Last Rate Last Dose  . omalizumab Geoffry Paradise) injection 300 mg  300 mg Subcutaneous Q28 days Jessica Priest, MD   300 mg at 05/20/19 9381    Allergies  Allergen Reactions  . Sulfa Antibiotics Rash and Hives  . Avelox [Moxifloxacin Hcl In Nacl] Nausea Only  . Spiriva Handihaler [Tiotropium Bromide Monohydrate] Swelling  . Tiotropium Swelling   Review of systems: Review of  systems negative except as noted in HPI / PMHx or noted below: Constitutional: Negative.  HENT: Negative.   Eyes: Negative.  Respiratory: Negative.   Cardiovascular: Negative.  Gastrointestinal: Negative.  Genitourinary: Negative.  Musculoskeletal: Negative.  Neurological: Negative.  Endo/Heme/Allergies: Negative.  Cutaneous: Negative.  Past Medical History:  Diagnosis Date  . Allergic rhinitis   . Asthma   . Hypertension   . Osteoporosis     Family History  Problem Relation Age of Onset  . Lung cancer Mother   . Asthma Father   . Allergic rhinitis Neg Hx   . Angioedema Neg Hx   . Eczema Neg Hx   . Immunodeficiency Neg Hx   . Urticaria Neg Hx     Social History   Socioeconomic History  . Marital status: Married    Spouse name: Not on file  . Number of children: Not on file  . Years of education: Not on file  . Highest education level: Not on file  Occupational History  . Not on file  Social Needs  . Financial resource strain: Not on file  . Food insecurity    Worry: Not on file  Inability: Not on file  . Transportation needs    Medical: Not on file    Non-medical: Not on file  Tobacco Use  . Smoking status: Never Smoker  . Smokeless tobacco: Never Used  Substance and Sexual Activity  . Alcohol use: No  . Drug use: No  . Sexual activity: Not on file  Lifestyle  . Physical activity    Days per week: Not on file    Minutes per session: Not on file  . Stress: Not on file  Relationships  . Social Herbalist on phone: Not on file    Gets together: Not on file    Attends religious service: Not on file    Active member of club or organization: Not on file    Attends meetings of clubs or organizations: Not on file    Relationship status: Not on file  . Intimate partner violence    Fear of current or ex partner: Not on file    Emotionally abused: Not on file    Physically abused: Not on file    Forced sexual activity: Not on file  Other  Topics Concern  . Not on file  Social History Narrative  . Not on file    I appreciate the opportunity to take part in Lexxie's care. Please do not hesitate to contact me with questions.  Sincerely,   R. Edgar Frisk, MD

## 2019-05-28 NOTE — Patient Instructions (Addendum)
Moderate persistent asthma Stable.    Continue Xolair injections every 4 weeks and Advair 230/21 g, 2 inhalations via spacer device twice a day.   During respiratory tract infections or asthma flares, add Flovent 110g 2 inhalations 2 times per day until symptoms have returned to baseline.  Subjective and objective measures of pulmonary function will be followed and the treatment plan will be adjusted accordingly.  Other allergic rhinitis  Continue appropriate allergen avoidance measures and nasal saline lavage.  A prescription has been provided for azelastine/fluticasone nasal spray, 1 spray per nostril twice daily as needed.   For thick post nasal drainage, add guaifenesin [phone] mg (Mucinex)  twice daily as needed with adequate hydration as discussed.  Gastroesophageal reflux disease  Continue appropriate reflux lifestyle modifications and famotidine 20 mg daily.  Add Tums, if needed.   Return in about 4 months (around 09/25/2019), or if symptoms worsen or fail to improve.

## 2019-05-28 NOTE — Assessment & Plan Note (Signed)
   Continue appropriate allergen avoidance measures and nasal saline lavage.  A prescription has been provided for azelastine/fluticasone nasal spray, 1 spray per nostril twice daily as needed.   For thick post nasal drainage, add guaifenesin [phone] mg (Mucinex)  twice daily as needed with adequate hydration as discussed.

## 2019-06-16 DIAGNOSIS — J4540 Moderate persistent asthma, uncomplicated: Secondary | ICD-10-CM

## 2019-06-17 ENCOUNTER — Other Ambulatory Visit: Payer: Self-pay

## 2019-06-17 ENCOUNTER — Ambulatory Visit (INDEPENDENT_AMBULATORY_CARE_PROVIDER_SITE_OTHER): Payer: Medicare Other

## 2019-06-17 DIAGNOSIS — J4540 Moderate persistent asthma, uncomplicated: Secondary | ICD-10-CM

## 2019-07-14 DIAGNOSIS — J4540 Moderate persistent asthma, uncomplicated: Secondary | ICD-10-CM | POA: Diagnosis not present

## 2019-07-15 ENCOUNTER — Other Ambulatory Visit: Payer: Self-pay

## 2019-07-15 ENCOUNTER — Ambulatory Visit (INDEPENDENT_AMBULATORY_CARE_PROVIDER_SITE_OTHER): Payer: Medicare Other

## 2019-07-15 DIAGNOSIS — J4540 Moderate persistent asthma, uncomplicated: Secondary | ICD-10-CM | POA: Diagnosis not present

## 2019-08-12 ENCOUNTER — Ambulatory Visit: Payer: Medicare Other

## 2019-09-09 DIAGNOSIS — J4540 Moderate persistent asthma, uncomplicated: Secondary | ICD-10-CM | POA: Diagnosis not present

## 2019-09-10 ENCOUNTER — Other Ambulatory Visit: Payer: Self-pay

## 2019-09-10 ENCOUNTER — Ambulatory Visit (INDEPENDENT_AMBULATORY_CARE_PROVIDER_SITE_OTHER): Payer: Medicare Other

## 2019-09-10 DIAGNOSIS — J4540 Moderate persistent asthma, uncomplicated: Secondary | ICD-10-CM | POA: Diagnosis not present

## 2019-09-25 ENCOUNTER — Ambulatory Visit: Payer: Medicare Other | Admitting: Allergy and Immunology

## 2019-10-02 ENCOUNTER — Other Ambulatory Visit: Payer: Self-pay | Admitting: Allergy and Immunology

## 2019-10-09 ENCOUNTER — Encounter: Payer: Self-pay | Admitting: Allergy and Immunology

## 2019-10-09 ENCOUNTER — Ambulatory Visit: Payer: Medicare Other | Admitting: Allergy and Immunology

## 2019-10-09 ENCOUNTER — Other Ambulatory Visit: Payer: Self-pay

## 2019-10-09 ENCOUNTER — Ambulatory Visit (INDEPENDENT_AMBULATORY_CARE_PROVIDER_SITE_OTHER): Payer: Medicare Other

## 2019-10-09 VITALS — BP 138/86 | HR 70 | Temp 98.1°F | Resp 16 | Ht 60.0 in | Wt 156.5 lb

## 2019-10-09 DIAGNOSIS — J3089 Other allergic rhinitis: Secondary | ICD-10-CM | POA: Diagnosis not present

## 2019-10-09 DIAGNOSIS — K068 Other specified disorders of gingiva and edentulous alveolar ridge: Secondary | ICD-10-CM | POA: Diagnosis not present

## 2019-10-09 DIAGNOSIS — K219 Gastro-esophageal reflux disease without esophagitis: Secondary | ICD-10-CM | POA: Diagnosis not present

## 2019-10-09 DIAGNOSIS — J4540 Moderate persistent asthma, uncomplicated: Secondary | ICD-10-CM | POA: Diagnosis not present

## 2019-10-09 MED ORDER — ALBUTEROL SULFATE HFA 108 (90 BASE) MCG/ACT IN AERS: 1.0000 | INHALATION_SPRAY | RESPIRATORY_TRACT | 3 refills | Status: DC | PRN

## 2019-10-09 NOTE — Assessment & Plan Note (Addendum)
   Follow-up with dentist for further evaluation of this issue.

## 2019-10-09 NOTE — Progress Notes (Signed)
Follow-up Note  RE: Susan Lyons MRN: [id number] DOB: 06-12-1956 Date of Office Visit: 10/09/2019  Primary care provider: System, Pcp Not In Referring provider: No ref. provider found  History of present illness: Susan Lyons is a 64 y.o. female with persistent asthma, allergic rhinitis, and esophageal reflux presenting today for follow-up.  She was last seen in this clinic in November 2020.  She reports that in the interval since her previous visit her asthma has been well controlled.  She is receiving  Xolair injections every 4 weeks without problems or complications.  She is taking Advair 230-21 g, 2 inhalations via spacer device twice daily.  While on this regimen she rarely requires albuterol rescue and does not experience limitations in normal daily activities or nocturnal awakenings due to lower respiratory symptoms.  She does use the albuterol prior to walking for exercise in the morning.   She reports that her nasal allergy symptoms are well controlled.  She is unable to use the azelastine nasal spray because it causes her to vomit.  Therefore, she is using fluticasone nasal spray daily for now.  She has used Mucinex as needed with benefit for thick postnasal drainage. Susan Lyons reports that her acid reflux has been well controlled with famotidine 20 mg daily.  She occasionally requires a Tums in addition to the famotidine if depending on how she manages her diet. She complains of a tender spot over her upper left gum.  The spot has been tender over the past month and "comes and goes."  No lesion is visible and she wonders if there is a tooth "root going up into the sinus".  Assessment and plan: Moderate persistent asthma Stable.    Continue Xolair injections every 4 weeks.  Continue Advair 230/21 g, 2 inhalations via spacer device twice a day.   During respiratory tract infections or asthma flares, add Flovent 110g 2 inhalations 2 times per day until symptoms have returned  to baseline.  Albuterol HFA, 1 to 2 inhalations every 4-6 hours if needed and 15 minutes prior to exercise.  A refill prescription has been provided for albuterol HFA.  Subjective and objective measures of pulmonary function will be followed and the treatment plan will be adjusted accordingly.  Other allergic rhinitis  Continue appropriate allergen avoidance measures and nasal saline lavage.  Continue fluticasone nasal spray, 1 spray per nostril twice daily as needed.   For thick post nasal drainage, add guaifenesin [phone] mg (Mucinex)  twice daily as needed with adequate hydration as discussed.  Gastroesophageal reflux disease  Continue appropriate reflux lifestyle modifications.  Continue famotidine (Pepcid) 20 mg 1-2 times daily as needed.    Add Tums, if needed.  Pain in gums  Follow-up with dentist for further evaluation of this issue.   Meds ordered this encounter  Medications  . albuterol (PROAIR HFA) 108 (90 Base) MCG/ACT inhaler    Sig: Inhale 1-2 puffs into the lungs every 4 (four) hours as needed for wheezing or shortness of breath.    Dispense:  6.7 g    Refill:  3    Diagnostics: Spirometry reveals an FVC of 1.87 L and an FEV1 of 1.54 L (75% predicted) with an FEV1 ratio of 106%.  This study was performed while the patient was asymptomatic.  Please see scanned spirometry results for details.    Physical examination: Blood pressure 138/86, pulse 70, temperature 98.1 F (36.7 C), temperature source Oral, resp. rate 16, height 5' (1.524 m), weight 156  lb 8.4 oz (71 kg), SpO2 96 %.  General: Alert, interactive, in no acute distress. HEENT: TMs pearly gray, turbinates minimally edematous without discharge, post-pharynx mildly erythematous.  Oral gums appear normal without lesions. Neck: Supple without lymphadenopathy. Lungs: Clear to auscultation without wheezing, rhonchi or rales. CV: Normal S1, S2 without murmurs. Skin: Warm and dry, without lesions or  rashes.  The following portions of the patient's history were reviewed and updated as appropriate: allergies, current medications, past family history, past medical history, past social history, past surgical history and problem list.  Current Outpatient Medications  Medication Sig Dispense Refill  . ADVAIR HFA 230-21 MCG/ACT inhaler INHALE 2 PUFFS BY MOUTH TWICE DAILY (REPLACING DULERA) 12 g 0  . albuterol (PROAIR HFA) 108 (90 Base) MCG/ACT inhaler Inhale 1-2 puffs into the lungs every 4 (four) hours as needed for wheezing or shortness of breath. 6.7 g 3  . albuterol (PROVENTIL) (2.5 MG/3ML) 0.083% nebulizer solution INHALE CONTENT OF 1 VIAL BY NEBULIZATION EVERY 6 HOURS AS NEEDED FOR WHEEZING OR SHORTNESS OF BREATH    . Calcium Citrate-Vitamin D ([employer] CALCIUM CITRATE+D3 PO) Take 500 mg by mouth.    . EPINEPHrine (EPIPEN 2-PAK) 0.3 mg/0.3 mL IJ SOAJ injection USE AS DIRECTED FOR SEVERE ALLERGIC REACTION 1 Device 2  . famotidine (PEPCID) 20 MG tablet Take 1 tablet (20 mg total) by mouth 2 (two) times daily. 60 tablet 5  . fluticasone (FLONASE) 50 MCG/ACT nasal spray Place 1 spray 2 (two) times daily into both nostrils. 16 g 5  . fluticasone (FLOVENT HFA) 110 MCG/ACT inhaler Two puffs with spacer twice a day during respiratory flares 1 Inhaler 5  . lisinopril-hydrochlorothiazide (PRINZIDE,ZESTORETIC) 10-12.5 MG tablet Take by mouth.    . loratadine (CLARITIN) 10 MG tablet Take 10 mg by mouth daily.    . Misc Natural Products (OSTEO BI-FLEX JOINT SHIELD) TABS Take by mouth.    . triamcinolone cream (KENALOG) 0.1 % Apply 1 application topically as needed.    Geoffry Paradise 150 MG injection INJECT 300 MG SUBCUTANEOUSLY EVERY 4 WEEKS. 2 each 10  . montelukast (SINGULAIR) 10 MG tablet Take 1 tablet (10 mg total) by mouth at bedtime. (Patient not taking: Reported on 05/28/2019) 30 tablet 5   Current Facility-Administered Medications  Medication Dose Route Frequency Provider Last Rate Last Admin  .  omalizumab Geoffry Paradise) injection 300 mg  300 mg Subcutaneous Q28 days Jessica Priest, MD   300 mg at 10/09/19 1115    Allergies  Allergen Reactions  . Sulfa Antibiotics Rash and Hives  . Avelox [Moxifloxacin Hcl In Nacl] Nausea Only  . Spiriva Handihaler [Tiotropium Bromide Monohydrate] Swelling  . Tiotropium Swelling   Review of systems: Review of systems negative except as noted in HPI / PMHx.  Past Medical History:  Diagnosis Date  . Allergic rhinitis   . Asthma   . Hypertension   . Osteoporosis     Family History  Problem Relation Age of Onset  . Lung cancer Mother   . Asthma Father   . Allergic rhinitis Neg Hx   . Angioedema Neg Hx   . Eczema Neg Hx   . Immunodeficiency Neg Hx   . Urticaria Neg Hx     Social History   Socioeconomic History  . Marital status: Married    Spouse name: Not on file  . Number of children: Not on file  . Years of education: Not on file  . Highest education level: Not on file  Occupational History  .  Not on file  Tobacco Use  . Smoking status: Never Smoker  . Smokeless tobacco: Never Used  Substance and Sexual Activity  . Alcohol use: No  . Drug use: No  . Sexual activity: Not on file  Other Topics Concern  . Not on file  Social History Narrative  . Not on file   Social Determinants of Health   Financial Resource Strain:   . Difficulty of Paying Living Expenses:   Food Insecurity:   . Worried About Programme researcher, broadcasting/film/video in the Last Year:   . Barista in the Last Year:   Transportation Needs:   . Freight forwarder (Medical):   Marland Kitchen Lack of Transportation (Non-Medical):   Physical Activity:   . Days of Exercise per Week:   . Minutes of Exercise per Session:   Stress:   . Feeling of Stress :   Social Connections:   . Frequency of Communication with Friends and Family:   . Frequency of Social Gatherings with Friends and Family:   . Attends Religious Services:   . Active Member of Clubs or Organizations:   .  Attends Banker Meetings:   Marland Kitchen Marital Status:   Intimate Partner Violence:   . Fear of Current or Ex-Partner:   . Emotionally Abused:   Marland Kitchen Physically Abused:   . Sexually Abused:     I appreciate the opportunity to take part in Susan Lyons's care. Please do not hesitate to contact me with questions.  Sincerely,   R. Jorene Guest, MD

## 2019-10-09 NOTE — Assessment & Plan Note (Signed)
   Continue appropriate allergen avoidance measures and nasal saline lavage.  Continue fluticasone nasal spray, 1 spray per nostril twice daily as needed.   For thick post nasal drainage, add guaifenesin [phone] mg (Mucinex)  twice daily as needed with adequate hydration as discussed.

## 2019-10-09 NOTE — Assessment & Plan Note (Signed)
Stable.    Continue Xolair injections every 4 weeks.  Continue Advair 230/21 g, 2 inhalations via spacer device twice a day.   During respiratory tract infections or asthma flares, add Flovent 110g 2 inhalations 2 times per day until symptoms have returned to baseline.  Albuterol HFA, 1 to 2 inhalations every 4-6 hours if needed and 15 minutes prior to exercise.  A refill prescription has been provided for albuterol HFA.  Subjective and objective measures of pulmonary function will be followed and the treatment plan will be adjusted accordingly.

## 2019-10-09 NOTE — Patient Instructions (Addendum)
Moderate persistent asthma Stable.    Continue Xolair injections every 4 weeks.  Continue Advair 230/21 g, 2 inhalations via spacer device twice a day.   During respiratory tract infections or asthma flares, add Flovent 110g 2 inhalations 2 times per day until symptoms have returned to baseline.  Albuterol HFA, 1 to 2 inhalations every 4-6 hours if needed and 15 minutes prior to exercise.  A refill prescription has been provided for albuterol HFA.  Subjective and objective measures of pulmonary function will be followed and the treatment plan will be adjusted accordingly.  Other allergic rhinitis  Continue appropriate allergen avoidance measures and nasal saline lavage.  Continue fluticasone nasal spray, 1 spray per nostril twice daily as needed.   For thick post nasal drainage, add guaifenesin [phone] mg (Mucinex)  twice daily as needed with adequate hydration as discussed.  Gastroesophageal reflux disease  Continue appropriate reflux lifestyle modifications.  Continue famotidine (Pepcid) 20 mg 1-2 times daily as needed.    Add Tums, if needed.  Pain in gums  Follow-up with dentist for further evaluation of this issue.   Return in about 4 months (around 02/08/2020), or if symptoms worsen or fail to improve.

## 2019-10-09 NOTE — Assessment & Plan Note (Signed)
   Continue appropriate reflux lifestyle modifications.  Continue famotidine (Pepcid) 20 mg 1-2 times daily as needed.    Add Tums, if needed.

## 2019-11-06 DIAGNOSIS — J4540 Moderate persistent asthma, uncomplicated: Secondary | ICD-10-CM | POA: Diagnosis not present

## 2019-11-07 ENCOUNTER — Ambulatory Visit (INDEPENDENT_AMBULATORY_CARE_PROVIDER_SITE_OTHER): Payer: Medicare Other

## 2019-11-07 ENCOUNTER — Other Ambulatory Visit: Payer: Self-pay

## 2019-11-07 DIAGNOSIS — J4540 Moderate persistent asthma, uncomplicated: Secondary | ICD-10-CM

## 2019-11-29 ENCOUNTER — Other Ambulatory Visit: Payer: Self-pay | Admitting: Allergy and Immunology

## 2019-12-04 DIAGNOSIS — J4540 Moderate persistent asthma, uncomplicated: Secondary | ICD-10-CM | POA: Diagnosis not present

## 2019-12-05 ENCOUNTER — Ambulatory Visit: Payer: Self-pay

## 2019-12-05 ENCOUNTER — Ambulatory Visit (INDEPENDENT_AMBULATORY_CARE_PROVIDER_SITE_OTHER): Payer: Medicare Other

## 2019-12-05 DIAGNOSIS — J4540 Moderate persistent asthma, uncomplicated: Secondary | ICD-10-CM

## 2019-12-31 DIAGNOSIS — J4540 Moderate persistent asthma, uncomplicated: Secondary | ICD-10-CM | POA: Diagnosis not present

## 2020-01-01 ENCOUNTER — Ambulatory Visit (INDEPENDENT_AMBULATORY_CARE_PROVIDER_SITE_OTHER): Payer: Medicare Other

## 2020-01-01 ENCOUNTER — Other Ambulatory Visit: Payer: Self-pay

## 2020-01-01 DIAGNOSIS — J4540 Moderate persistent asthma, uncomplicated: Secondary | ICD-10-CM | POA: Diagnosis not present

## 2020-01-02 ENCOUNTER — Ambulatory Visit: Payer: Medicare Other

## 2020-01-28 DIAGNOSIS — J4540 Moderate persistent asthma, uncomplicated: Secondary | ICD-10-CM | POA: Diagnosis not present

## 2020-01-29 ENCOUNTER — Ambulatory Visit (INDEPENDENT_AMBULATORY_CARE_PROVIDER_SITE_OTHER): Payer: Medicare Other

## 2020-01-29 DIAGNOSIS — J4540 Moderate persistent asthma, uncomplicated: Secondary | ICD-10-CM | POA: Diagnosis not present

## 2020-02-25 DIAGNOSIS — J4540 Moderate persistent asthma, uncomplicated: Secondary | ICD-10-CM

## 2020-02-26 ENCOUNTER — Ambulatory Visit (INDEPENDENT_AMBULATORY_CARE_PROVIDER_SITE_OTHER): Payer: Medicare Other

## 2020-02-26 ENCOUNTER — Other Ambulatory Visit: Payer: Self-pay

## 2020-02-26 DIAGNOSIS — J4540 Moderate persistent asthma, uncomplicated: Secondary | ICD-10-CM | POA: Diagnosis not present

## 2020-03-17 ENCOUNTER — Other Ambulatory Visit: Payer: Self-pay | Admitting: Allergy and Immunology

## 2020-03-24 DIAGNOSIS — J4540 Moderate persistent asthma, uncomplicated: Secondary | ICD-10-CM | POA: Diagnosis not present

## 2020-03-25 ENCOUNTER — Other Ambulatory Visit: Payer: Self-pay

## 2020-03-25 ENCOUNTER — Ambulatory Visit (INDEPENDENT_AMBULATORY_CARE_PROVIDER_SITE_OTHER): Payer: Medicare Other

## 2020-03-25 DIAGNOSIS — J4540 Moderate persistent asthma, uncomplicated: Secondary | ICD-10-CM | POA: Diagnosis not present

## 2020-04-15 NOTE — Progress Notes (Signed)
[address] [place] [address] Dept: [phone]  FOLLOW UP NOTE  Patient ID: Susan Lyons, female    DOB: [birth year]  Age: 64 y.o. MRN: [id number] Date of Office Visit: 04/16/2020  Assessment  Chief Complaint: Asthma  HPI Susan Lyons is a 64 year old female who presents to the clinic for follow-up visit.  She was last seen in this clinic on 10/09/2019 by Dr. Nunzio Cobbs for evaluation of asthma, allergic rhinitis, and reflux.  At today's visit she reports her asthma has been well controlled with occasional shortness of breath occurring during high humidity, no wheeze, and frequent cough producing thick clear mucus for the last 6 years.  She continues Advair 2 puffs twice a day with a spacer and has not needed to use her albuterol for several months.  She has not needed to use her Flovent 110 for asthma flare in over a year.  She continues to receive Xolair 300 mg injection once every 4 weeks with no adverse reactions.  She reports while continuing on Xolair her symptoms of asthma have significantly decreased.  Allergic rhinitis is reported as moderately well controlled with occasional sneeze and copious postnasal drainage for which she continues Flonase daily with good technique, Mucinex as needed, and nasal saline rinses as needed.  Reflux is reported as moderately well controlled with heartburn occurring after foods such as spicy or [place] food for which she takes famotidine 20 mg once a day and occasionally takes Tums at nighttime with relief of symptoms.  She reports the sore area on her left upper gum has resolved.  Her current medications are listed in the chart.   Drug Allergies:  Allergies  Allergen Reactions   Sulfa Antibiotics Rash and Hives   Avelox [Moxifloxacin Hcl In Nacl] Nausea Only   Spiriva Handihaler [Tiotropium Bromide Monohydrate] Swelling   Tiotropium Swelling    Physical Exam: BP 120/70    Pulse 92    Temp 99.1 F (37.3 C) (Tympanic)    Resp (!)  24    Ht 4' 11.5" (1.511 m)    Wt 154 lb (69.9 kg)    SpO2 96%    BMI 30.58 kg/m    Physical Exam Vitals reviewed.  Constitutional:      Appearance: Normal appearance.  HENT:     Head: Normocephalic and atraumatic.     Right Ear: Tympanic membrane normal.     Left Ear: Tympanic membrane normal.     Nose:     Comments: Bilateral nares normal.  Pharynx normal.  Ears normal.  Eyes normal.    Mouth/Throat:     Pharynx: Oropharynx is clear.  Eyes:     Conjunctiva/sclera: Conjunctivae normal.  Cardiovascular:     Rate and Rhythm: Normal rate and regular rhythm.     Heart sounds: Normal heart sounds.  Pulmonary:     Effort: Pulmonary effort is normal.     Breath sounds: Normal breath sounds.     Comments: Lungs clear to auscultation Musculoskeletal:        General: Normal range of motion.     Cervical back: Normal range of motion and neck supple.  Skin:    General: Skin is warm and dry.  Neurological:     Mental Status: She is alert and oriented to person, place, and time.  Psychiatric:        Mood and Affect: Mood normal.        Behavior: Behavior normal.  Thought Content: Thought content normal.        Judgment: Judgment normal.     Diagnostics: FVC 1.87, FEV1 1.45.  Predicted FVC 2.64, predicted FEV1 2.01.  Spirometry indicates mild restriction.  This is consistent with previous spirometry readings.  Assessment and Plan: 1. Moderate persistent asthma without complication   2. Other allergic rhinitis   3. Gastroesophageal reflux disease, unspecified whether esophagitis present     Meds ordered this encounter  Medications   fluticasone-salmeterol (ADVAIR HFA) 230-21 MCG/ACT inhaler    Sig: INHALE 2 PUFFS BY MOUTH TWICE DAILY (REPLACING DULERA)    Dispense:  12 g    Refill:  1    Last fill needs appt   albuterol (PROAIR HFA) 108 (90 Base) MCG/ACT inhaler    Sig: Inhale 1-2 puffs into the lungs every 4 (four) hours as needed for wheezing or shortness of breath.     Dispense:  6.7 g    Refill:  1    Patient Instructions  Asthma Continue Advair 230-2 puffs twice a day with a spacer to prevent cough or wheeze Continue albuterol 2 puffs every 4 hours as needed for cough or wheeze OR Instead use albuterol 0.083% solution via nebulizer one unit vial every 4 hours as needed for cough or wheeze You may use albuterol 2 puffs 5 to 15 minutes before activity to decrease cough or wheeze Continue Xolair 300 mg injection once every 28 days and have access to an epinephrine autoinjector set  Allergic rhinitis Continue avoidance measures directed toward mold as listed below Continue Flonase 2 sprays in each nostril once a day as needed for stuffy nose For thick postnasal drainage, begin Mucinex 600 to 1200 mg twice a day as needed Consider saline nasal rinses as needed for nasal symptoms. Use this before any medicated nasal sprays for best result  Reflux Continue dietary and lifestyle modifications as listed below Continue famotidine 20 mg once or twice a day as needed to control reflux  Call the clinic if this treatment plan is not working well for you  Follow up in 6 months or sooner if needed.   Return in about 6 months (around 10/15/2020), or if symptoms worsen or fail to improve.    Thank you for the opportunity to care for this patient.  Please do not hesitate to contact me with questions.  Thermon Leyland, FNP [facility]

## 2020-04-15 NOTE — Patient Instructions (Signed)
Asthma Continue Advair 230-2 puffs twice a day with a spacer to prevent cough or wheeze Continue albuterol 2 puffs every 4 hours as needed for cough or wheeze OR Instead use albuterol 0.083% solution via nebulizer one unit vial every 4 hours as needed for cough or wheeze You may use albuterol 2 puffs 5 to 15 minutes before activity to decrease cough or wheeze Continue Xolair 300 mg injection once every 28 days and have access to an epinephrine autoinjector set  Allergic rhinitis Continue avoidance measures directed toward mold as listed below Continue Flonase 2 sprays in each nostril once a day as needed for stuffy nose For thick postnasal drainage, begin Mucinex 600 to 1200 mg twice a day as needed Consider saline nasal rinses as needed for nasal symptoms. Use this before any medicated nasal sprays for best result  Reflux Continue dietary and lifestyle modifications as listed below Continue famotidine 20 mg once or twice a day as needed to control reflux  Call the clinic if this treatment plan is not working well for you  Follow up in 6 months or sooner if needed.  Control of Mold Allergen Mold and fungi can grow on a variety of surfaces provided certain temperature and moisture conditions exist.  Outdoor molds grow on plants, decaying vegetation and soil.  The major outdoor mold, Alternaria and Cladosporium, are found in very high numbers during hot and dry conditions.  Generally, a late Summer - Fall peak is seen for common outdoor fungal spores.  Rain will temporarily lower outdoor mold spore count, but counts rise rapidly when the rainy period ends.  The most important indoor molds are Aspergillus and Penicillium.  Dark, humid and poorly ventilated basements are ideal sites for mold growth.  The next most common sites of mold growth are the bathroom and the kitchen.  Outdoor [employer] 1. Use air conditioning and keep windows closed 2. Avoid exposure to decaying vegetation. 3. Avoid  leaf raking. 4. Avoid grain handling. 5. Consider wearing a face mask if working in moldy areas.  Indoor Mold Control 1. Maintain humidity below 50%. 2. Clean washable surfaces with 5% bleach solution. 3. Remove sources e.g. Contaminated carpets.    Lifestyle Changes for Controlling GERD When you have GERD, stomach acid feels as if it's backing up toward your mouth. Whether or not you take medication to control your GERD, your symptoms can often be improved with lifestyle changes.   Raise Your Head  Reflux is more likely to strike when you're lying down flat, because stomach fluid can  flow backward more easily. Raising the head of your bed 4-6 inches can help. To do this:  Slide blocks or books under the legs at the head of your bed. Or, place a wedge under  the mattress. Many foam stores can make a suitable wedge for you. The wedge  should run from your waist to the top of your head.  Don't just prop your head on several pillows. This increases pressure on your  stomach. It can make GERD worse.  Watch Your Eating Habits Certain foods may increase the acid in your stomach or relax the lower esophageal sphincter, making GERD more likely. It's best to avoid the following:  Coffee, tea, and carbonated drinks (with and without caffeine)  Fatty, fried, or spicy food  Mint, chocolate, onions, and tomatoes  Any other foods that seem to irritate your stomach or cause you pain  Relieve the Pressure  Eat smaller meals, even if you  have to eat more often.  Don't lie down right after you eat. Wait a few hours for your stomach to empty.  Avoid tight belts and tight-fitting clothes.  Lose excess weight.  Tobacco and Alcohol  Avoid smoking tobacco and drinking alcohol. They can make GERD symptoms worse.

## 2020-04-16 ENCOUNTER — Other Ambulatory Visit: Payer: Self-pay

## 2020-04-16 ENCOUNTER — Ambulatory Visit (INDEPENDENT_AMBULATORY_CARE_PROVIDER_SITE_OTHER): Payer: Medicare Other | Admitting: Family Medicine

## 2020-04-16 ENCOUNTER — Encounter: Payer: Self-pay | Admitting: Family Medicine

## 2020-04-16 VITALS — BP 120/70 | HR 92 | Temp 99.1°F | Resp 24 | Ht 59.5 in | Wt 154.0 lb

## 2020-04-16 DIAGNOSIS — J4540 Moderate persistent asthma, uncomplicated: Secondary | ICD-10-CM | POA: Diagnosis not present

## 2020-04-16 DIAGNOSIS — J3089 Other allergic rhinitis: Secondary | ICD-10-CM | POA: Diagnosis not present

## 2020-04-16 DIAGNOSIS — K219 Gastro-esophageal reflux disease without esophagitis: Secondary | ICD-10-CM | POA: Diagnosis not present

## 2020-04-16 MED ORDER — ADVAIR HFA 230-21 MCG/ACT IN AERO: INHALATION_SPRAY | RESPIRATORY_TRACT | 1 refills | Status: DC

## 2020-04-16 MED ORDER — ALBUTEROL SULFATE HFA 108 (90 BASE) MCG/ACT IN AERS: 1.0000 | INHALATION_SPRAY | RESPIRATORY_TRACT | 1 refills | Status: DC | PRN

## 2020-04-19 ENCOUNTER — Telehealth: Payer: Self-pay | Admitting: Allergy and Immunology

## 2020-04-19 ENCOUNTER — Telehealth: Payer: Self-pay

## 2020-04-19 MED ORDER — FLUTICASONE-SALMETEROL 250-50 MCG/DOSE IN AEPB: INHALATION_SPRAY | RESPIRATORY_TRACT | 5 refills | Status: DC

## 2020-04-19 MED ORDER — ADVAIR HFA 230-21 MCG/ACT IN AERO: INHALATION_SPRAY | RESPIRATORY_TRACT | 1 refills | Status: DC

## 2020-04-19 MED ORDER — ALBUTEROL SULFATE HFA 108 (90 BASE) MCG/ACT IN AERS: 1.0000 | INHALATION_SPRAY | RESPIRATORY_TRACT | 1 refills | Status: DC | PRN

## 2020-04-19 MED ORDER — FLUTICASONE-SALMETEROL 250-50 MCG/DOSE IN AEPB: 1.0000 | INHALATION_SPRAY | Freq: Two times a day (BID) | RESPIRATORY_TRACT | 5 refills | Status: DC

## 2020-04-19 NOTE — Telephone Encounter (Signed)
Is there another Inhaler that maybe cheaper? We looked at her formulary and all are tier 3. Please advise

## 2020-04-19 NOTE — Addendum Note (Signed)
Addended by: Dub Mikes on: 04/19/2020 09:05 AM   Modules accepted: Orders

## 2020-04-19 NOTE — Telephone Encounter (Signed)
I have no way of knowing if there is a less expensive option.  Is Wixela an option?

## 2020-04-19 NOTE — Telephone Encounter (Signed)
Patient states that her Advair is $86 a month and she cannot afford that right now. Patient would like to know if there is a cheaper alternative.  Please advise.

## 2020-04-19 NOTE — Telephone Encounter (Signed)
Refer to previous note. Thanks for your help

## 2020-04-19 NOTE — Telephone Encounter (Signed)
I will route this to Thurston Hole because she saw this patient on Friday. Thurston Hole is aware.

## 2020-04-19 NOTE — Telephone Encounter (Signed)
I spoke to the pharmacist at [facility] the pt.'s plan doesn't cover generics. It has to be name brand.

## 2020-04-20 DIAGNOSIS — J4540 Moderate persistent asthma, uncomplicated: Secondary | ICD-10-CM

## 2020-04-20 NOTE — Telephone Encounter (Signed)
Lets have her continue 2 puffs twice a day and get her a 1 month sample of AirDuo Digihaler for the last month. In January lets get the Advair 230 reordered please. Thank you

## 2020-04-20 NOTE — Telephone Encounter (Signed)
Left message for pt. To return call regarding her inhaler.

## 2020-04-20 NOTE — Telephone Encounter (Signed)
Left message for pt. To return my call regarding her inhaler.

## 2020-04-20 NOTE — Telephone Encounter (Signed)
Spoke with pt. She was given advair 230/21 back in April. She was on the [facility] which her husband was fussing about the cost so that's when Dr. Nunzio Cobbs changed her to the Advair hfa 230/21 2 puffs twice a day. When pt. Is not in the donut hole the advair hfa 230/21 cost is 35.00 dollars a month, but since pt. Is in the donut hole her cost is 86.00 dollars a month. Question can pt. Take 1 puff twice a day to stretch her inhaler out for 2 months. Is pt.'s breathing ok to do that.

## 2020-04-21 ENCOUNTER — Ambulatory Visit: Payer: Medicare Other

## 2020-04-21 ENCOUNTER — Other Ambulatory Visit: Payer: Self-pay

## 2020-04-21 ENCOUNTER — Ambulatory Visit (INDEPENDENT_AMBULATORY_CARE_PROVIDER_SITE_OTHER): Payer: Medicare Other

## 2020-04-21 DIAGNOSIS — J4540 Moderate persistent asthma, uncomplicated: Secondary | ICD-10-CM

## 2020-04-21 NOTE — Telephone Encounter (Signed)
Spoke to the pt. To let her know when she finishes her advair 230/21 that we will temporarily put her on airduo digihaler 113/14  Sample given one puff twice a day until January when she is out of the donut hole. Pt. Then will go back on advair 230/21 mcg will call her pharmacy to see if they accept coupon from medicare pt. Since she has an additional supplement plan. I checked with the pharmacy coupons cannot be used with medicare pt.'s.

## 2020-05-19 ENCOUNTER — Ambulatory Visit: Payer: Medicare Other

## 2020-06-15 DIAGNOSIS — J4540 Moderate persistent asthma, uncomplicated: Secondary | ICD-10-CM | POA: Diagnosis not present

## 2020-06-16 ENCOUNTER — Ambulatory Visit (INDEPENDENT_AMBULATORY_CARE_PROVIDER_SITE_OTHER): Payer: Medicare Other

## 2020-06-16 ENCOUNTER — Other Ambulatory Visit: Payer: Self-pay

## 2020-06-16 DIAGNOSIS — J4540 Moderate persistent asthma, uncomplicated: Secondary | ICD-10-CM

## 2020-07-13 DIAGNOSIS — J4540 Moderate persistent asthma, uncomplicated: Secondary | ICD-10-CM | POA: Diagnosis not present

## 2020-07-14 ENCOUNTER — Other Ambulatory Visit: Payer: Self-pay

## 2020-07-14 ENCOUNTER — Ambulatory Visit (INDEPENDENT_AMBULATORY_CARE_PROVIDER_SITE_OTHER): Payer: Medicare Other

## 2020-07-14 DIAGNOSIS — J4540 Moderate persistent asthma, uncomplicated: Secondary | ICD-10-CM

## 2020-08-10 DIAGNOSIS — J4540 Moderate persistent asthma, uncomplicated: Secondary | ICD-10-CM | POA: Diagnosis not present

## 2020-08-11 ENCOUNTER — Ambulatory Visit (INDEPENDENT_AMBULATORY_CARE_PROVIDER_SITE_OTHER): Payer: Medicare Other

## 2020-08-11 ENCOUNTER — Other Ambulatory Visit: Payer: Self-pay

## 2020-08-11 DIAGNOSIS — J4540 Moderate persistent asthma, uncomplicated: Secondary | ICD-10-CM

## 2020-09-07 DIAGNOSIS — J4540 Moderate persistent asthma, uncomplicated: Secondary | ICD-10-CM | POA: Diagnosis not present

## 2020-09-08 ENCOUNTER — Ambulatory Visit (INDEPENDENT_AMBULATORY_CARE_PROVIDER_SITE_OTHER): Payer: Medicare Other | Admitting: *Deleted

## 2020-09-08 ENCOUNTER — Other Ambulatory Visit: Payer: Self-pay

## 2020-09-08 DIAGNOSIS — J4540 Moderate persistent asthma, uncomplicated: Secondary | ICD-10-CM

## 2020-10-05 DIAGNOSIS — J4540 Moderate persistent asthma, uncomplicated: Secondary | ICD-10-CM | POA: Diagnosis not present

## 2020-10-06 ENCOUNTER — Ambulatory Visit (INDEPENDENT_AMBULATORY_CARE_PROVIDER_SITE_OTHER): Payer: Medicare Other | Admitting: *Deleted

## 2020-10-06 ENCOUNTER — Other Ambulatory Visit: Payer: Self-pay

## 2020-10-06 DIAGNOSIS — J4540 Moderate persistent asthma, uncomplicated: Secondary | ICD-10-CM

## 2020-10-11 ENCOUNTER — Ambulatory Visit (HOSPITAL_BASED_OUTPATIENT_CLINIC_OR_DEPARTMENT_OTHER)
Admission: RE | Admit: 2020-10-11 | Discharge: 2020-10-11 | Disposition: A | Discharge status: Home / Self Care | Payer: Medicare Other | Source: Ambulatory Visit | Attending: Family Medicine | Admitting: Family Medicine

## 2020-10-11 ENCOUNTER — Other Ambulatory Visit: Payer: Self-pay

## 2020-10-11 ENCOUNTER — Ambulatory Visit (INDEPENDENT_AMBULATORY_CARE_PROVIDER_SITE_OTHER): Payer: Medicare Other | Admitting: Family Medicine

## 2020-10-11 ENCOUNTER — Encounter: Payer: Self-pay | Admitting: Family Medicine

## 2020-10-11 VITALS — BP 124/72 | HR 84 | Temp 98.1°F | Resp 20

## 2020-10-11 DIAGNOSIS — J4541 Moderate persistent asthma with (acute) exacerbation: Secondary | ICD-10-CM | POA: Diagnosis not present

## 2020-10-11 DIAGNOSIS — J4540 Moderate persistent asthma, uncomplicated: Secondary | ICD-10-CM | POA: Diagnosis not present

## 2020-10-11 DIAGNOSIS — R059 Cough, unspecified: Secondary | ICD-10-CM

## 2020-10-11 DIAGNOSIS — K219 Gastro-esophageal reflux disease without esophagitis: Secondary | ICD-10-CM | POA: Diagnosis not present

## 2020-10-11 DIAGNOSIS — J3089 Other allergic rhinitis: Secondary | ICD-10-CM | POA: Diagnosis not present

## 2020-10-11 MED ORDER — BUDESONIDE 0.5 MG/2ML IN SUSP: RESPIRATORY_TRACT | 5 refills | Status: DC

## 2020-10-11 MED ORDER — FAMOTIDINE 20 MG PO TABS: 20.0000 mg | ORAL_TABLET | Freq: Two times a day (BID) | ORAL | 2 refills | Status: DC

## 2020-10-11 NOTE — Progress Notes (Signed)
[address] [place] [address] Dept: [phone]  FOLLOW UP NOTE  Patient ID: DAWNYA Lyons, female    DOB: Jan 06, 1956  Age: 65 y.o. MRN: [id number] Date of Office Visit: 10/11/2020  Assessment  Chief Complaint: Asthma  HPI Susan Lyons is a 65 year old female who presents to the clinic for follow-up visit.  Her last visit to this clinic was 04/16/2020 for evaluation of asthma, allergic rhinitis, and reflux.  At today's visit, she reports her asthma has been "okay" with symptoms including shortness of breath occurring intermittently that is not worse with activity, wheezing occurring intermittently during the day and at night, and cough producing mucus which is yellow to brown in color.  She denies fever, sweats, chills, and sick contacts. She continues Advair 230-2 puffs twice a day with a spacer, albuterol as needed, and Xolair 300 mg injections once every 4 weeks.  She reports she has tried montelukast which made her irritable as well as LAMA which caused an anaphylactic reaction. Allergic rhinitis is reported as not well controlled with symptoms including nasal congestion mostly on the left side, sneezing, and copious nasal drainage. She continues loratadine 10 mg once a day, and Flonase 1 spray in each nostril twice a day. Reflux is reported as not well controlled with frequent heartburn for which she takes famotidine and often needs Tums for breakthrough heartburn. She reports that she has taken omeprazole in the past and has stopped this medication due to osteopetrosis. Her current medications are listed in the chart.    Drug Allergies:  Allergies  Allergen Reactions  . Sulfa Antibiotics Rash and Hives  . Avelox [Moxifloxacin Hcl In Nacl] Nausea Only  . Spiriva Handihaler [Tiotropium Bromide Monohydrate] Swelling  . Tiotropium Swelling    Physical Exam: BP 124/72   Pulse 84   Temp 98.1 F (36.7 C) (Temporal)   Resp 20   SpO2 96%    Physical Exam Vitals  reviewed.  Constitutional:      Appearance: Normal appearance.  HENT:     Head: Normocephalic and atraumatic.     Right Ear: Tympanic membrane normal.     Left Ear: Tympanic membrane normal.     Nose:     Comments: Bilateral nares slightly erythematous with clear nasal drainage noted.  Pharynx normal.  Ears normal.  Eyes normal.    Mouth/Throat:     Pharynx: Oropharynx is clear.  Eyes:     Conjunctiva/sclera: Conjunctivae normal.  Cardiovascular:     Rate and Rhythm: Normal rate and regular rhythm.     Heart sounds: Normal heart sounds. No murmur heard.   Pulmonary:     Effort: Pulmonary effort is normal.     Comments: Scattered bilateral expiratory wheeze which did not clear postbronchodilator therapy. Musculoskeletal:        General: Normal range of motion.     Cervical back: Normal range of motion and neck supple.  Skin:    General: Skin is warm and dry.  Neurological:     Mental Status: She is alert and oriented to person, place, and time.  Psychiatric:        Mood and Affect: Mood normal.        Behavior: Behavior normal.        Thought Content: Thought content normal.        Judgment: Judgment normal.     Diagnostics: FVC 1.98, FEV1 1.53.  Predicted FVC 2.64, predicted FEV1 2.01.  Spirometry indicates mild restriction.  Postbronchodilator  spirometry FVC 2.06, FEV1 1.67.  Postbronchodilator spirometry indicates mild restriction with no significant bronchodilator response.  Assessment and Plan: 1. Moderate persistent asthma with acute exacerbation   2. Other allergic rhinitis   3. Gastroesophageal reflux disease, unspecified whether esophagitis present   4. Cough     Meds ordered this encounter  Medications  . budesonide (PULMICORT) 0.5 MG/2ML nebulizer solution    Sig: One vial in nebulizer twice daily to prevent coughing or wheezing    Dispense:  120 mL    Refill:  5    Please dispense name or generic whichever insurance covers  . famotidine (PEPCID) 20 MG  tablet    Sig: Take 1 tablet (20 mg total) by mouth 2 (two) times daily.    Dispense:  60 tablet    Refill:  2    Patient Instructions  Asthma Continue Advair 230-2 puffs twice a day with a spacer to prevent cough or wheeze Continue albuterol 2 puffs every 4 hours as needed for cough or wheeze OR Instead use albuterol 0.083% solution via nebulizer one unit vial every 4 hours as needed for cough or wheeze You may use albuterol 2 puffs 5 to 15 minutes before activity to decrease cough or wheeze Continue Xolair 300 mg injection once every 28 days and have access to an epinephrine autoinjector set  Cough Get a chest xray We will call you when the result becomes available  Allergic rhinitis Continue avoidance measures directed toward mold as listed below Continue Flonase 2 sprays in each nostril once a day as needed for stuffy nose For thick postnasal drainage, begin Mucinex 600 to 1200 mg twice a day as needed Consider saline nasal rinses as needed for nasal symptoms. Use this before any medicated nasal sprays for best result  Reflux Continue dietary and lifestyle modifications as listed below Increase famotidine 20 mg to twice a day to control reflux  Call the clinic if this treatment plan is not working well for you  Follow up in 2 months or sooner if needed.   Return in about 2 months (around 12/11/2020), or if symptoms worsen or fail to improve.    Thank you for the opportunity to care for this patient.  Please do not hesitate to contact me with questions.  Thermon Leyland, FNP [facility]

## 2020-10-11 NOTE — Patient Instructions (Addendum)
Asthma Continue Advair 230-2 puffs twice a day with a spacer to prevent cough or wheeze Continue albuterol 2 puffs every 4 hours as needed for cough or wheeze OR Instead use albuterol 0.083% solution via nebulizer one unit vial every 4 hours as needed for cough or wheeze You may use albuterol 2 puffs 5 to 15 minutes before activity to decrease cough or wheeze Continue Xolair 300 mg injection once every 28 days and have access to an epinephrine autoinjector set  Cough Get a chest xray We will call you when the result becomes available  Allergic rhinitis Continue avoidance measures directed toward mold as listed below Continue Flonase 2 sprays in each nostril once a day as needed for stuffy nose For thick postnasal drainage, begin Mucinex 600 to 1200 mg twice a day as needed Consider saline nasal rinses as needed for nasal symptoms. Use this before any medicated nasal sprays for best result  Reflux Continue dietary and lifestyle modifications as listed below Increase famotidine 20 mg to twice a day to control reflux  Call the clinic if this treatment plan is not working well for you  Follow up in 2 months or sooner if needed.  Control of Mold Allergen Mold and fungi can grow on a variety of surfaces provided certain temperature and moisture conditions exist.  Outdoor molds grow on plants, decaying vegetation and soil.  The major outdoor mold, Alternaria and Cladosporium, are found in very high numbers during hot and dry conditions.  Generally, a late Summer - Fall peak is seen for common outdoor fungal spores.  Rain will temporarily lower outdoor mold spore count, but counts rise rapidly when the rainy period ends.  The most important indoor molds are Aspergillus and Penicillium.  Dark, humid and poorly ventilated basements are ideal sites for mold growth.  The next most common sites of mold growth are the bathroom and the kitchen.  Outdoor [employer] 1. Use air conditioning and keep  windows closed 2. Avoid exposure to decaying vegetation. 3. Avoid leaf raking. 4. Avoid grain handling. 5. Consider wearing a face mask if working in moldy areas.  Indoor Mold Control 1. Maintain humidity below 50%. 2. Clean washable surfaces with 5% bleach solution. 3. Remove sources e.g. Contaminated carpets.    Lifestyle Changes for Controlling GERD When you have GERD, stomach acid feels as if it's backing up toward your mouth. Whether or not you take medication to control your GERD, your symptoms can often be improved with lifestyle changes.   Raise Your Head  Reflux is more likely to strike when you're lying down flat, because stomach fluid can  flow backward more easily. Raising the head of your bed 4-6 inches can help. To do this:  Slide blocks or books under the legs at the head of your bed. Or, place a wedge under  the mattress. Many foam stores can make a suitable wedge for you. The wedge  should run from your waist to the top of your head.  Don't just prop your head on several pillows. This increases pressure on your  stomach. It can make GERD worse.  Watch Your Eating Habits Certain foods may increase the acid in your stomach or relax the lower esophageal sphincter, making GERD more likely. It's best to avoid the following:  Coffee, tea, and carbonated drinks (with and without caffeine)  Fatty, fried, or spicy food  Mint, chocolate, onions, and tomatoes  Any other foods that seem to irritate your stomach or cause you  pain  Relieve the Pressure  Eat smaller meals, even if you have to eat more often.  Don't lie down right after you eat. Wait a few hours for your stomach to empty.  Avoid tight belts and tight-fitting clothes.  Lose excess weight.  Tobacco and Alcohol  Avoid smoking tobacco and drinking alcohol. They can make GERD symptoms worse.

## 2020-10-11 NOTE — Telephone Encounter (Signed)
I sent in the pulmicort 0.5 mg through the open chart of the pt.'s.

## 2020-10-11 NOTE — Progress Notes (Signed)
Can you please let this patient know that her chest xray is clear. Please have her add on budesonide 0.5 mg twice a day via nebulizer for the next 2 weeks or until cough and wheeze free. Please have her restart Mucinex [phone] mg twice a day and continue Flonase and nasal saline rinses. Please have her call the clinic if her symptoms worsen or do not improve. Thank you

## 2020-11-09 DIAGNOSIS — J4540 Moderate persistent asthma, uncomplicated: Secondary | ICD-10-CM | POA: Diagnosis not present

## 2020-11-10 ENCOUNTER — Other Ambulatory Visit: Payer: Self-pay

## 2020-11-10 ENCOUNTER — Ambulatory Visit (INDEPENDENT_AMBULATORY_CARE_PROVIDER_SITE_OTHER): Payer: Medicare Other

## 2020-11-10 DIAGNOSIS — J4540 Moderate persistent asthma, uncomplicated: Secondary | ICD-10-CM

## 2020-11-10 DIAGNOSIS — J4541 Moderate persistent asthma with (acute) exacerbation: Secondary | ICD-10-CM

## 2020-12-07 DIAGNOSIS — J4541 Moderate persistent asthma with (acute) exacerbation: Secondary | ICD-10-CM | POA: Diagnosis not present

## 2020-12-08 ENCOUNTER — Ambulatory Visit (INDEPENDENT_AMBULATORY_CARE_PROVIDER_SITE_OTHER): Payer: Medicare Other

## 2020-12-08 ENCOUNTER — Other Ambulatory Visit: Payer: Self-pay

## 2020-12-08 DIAGNOSIS — J4541 Moderate persistent asthma with (acute) exacerbation: Secondary | ICD-10-CM | POA: Diagnosis not present

## 2020-12-13 ENCOUNTER — Ambulatory Visit: Payer: Medicare Other | Admitting: Family Medicine

## 2020-12-30 ENCOUNTER — Other Ambulatory Visit: Payer: Self-pay

## 2020-12-30 MED ORDER — FLUTICASONE-SALMETEROL 250-50 MCG/ACT IN AEPB: 1.0000 | INHALATION_SPRAY | Freq: Two times a day (BID) | RESPIRATORY_TRACT | 1 refills | Status: DC

## 2021-01-04 DIAGNOSIS — J4541 Moderate persistent asthma with (acute) exacerbation: Secondary | ICD-10-CM

## 2021-01-05 ENCOUNTER — Ambulatory Visit (INDEPENDENT_AMBULATORY_CARE_PROVIDER_SITE_OTHER): Payer: Medicare Other

## 2021-01-05 ENCOUNTER — Other Ambulatory Visit: Payer: Self-pay

## 2021-01-05 DIAGNOSIS — J4541 Moderate persistent asthma with (acute) exacerbation: Secondary | ICD-10-CM

## 2021-02-01 DIAGNOSIS — J4540 Moderate persistent asthma, uncomplicated: Secondary | ICD-10-CM

## 2021-02-02 ENCOUNTER — Other Ambulatory Visit: Payer: Self-pay

## 2021-02-02 ENCOUNTER — Ambulatory Visit (INDEPENDENT_AMBULATORY_CARE_PROVIDER_SITE_OTHER): Payer: Medicare Other

## 2021-02-02 DIAGNOSIS — J4540 Moderate persistent asthma, uncomplicated: Secondary | ICD-10-CM | POA: Diagnosis not present

## 2021-02-02 DIAGNOSIS — J4541 Moderate persistent asthma with (acute) exacerbation: Secondary | ICD-10-CM

## 2021-02-07 ENCOUNTER — Ambulatory Visit (INDEPENDENT_AMBULATORY_CARE_PROVIDER_SITE_OTHER): Payer: Medicare Other | Admitting: Family Medicine

## 2021-02-07 ENCOUNTER — Other Ambulatory Visit: Payer: Self-pay

## 2021-02-07 ENCOUNTER — Encounter: Payer: Self-pay | Admitting: Family Medicine

## 2021-02-07 VITALS — BP 130/70 | HR 75 | Resp 20

## 2021-02-07 DIAGNOSIS — J4540 Moderate persistent asthma, uncomplicated: Secondary | ICD-10-CM

## 2021-02-07 DIAGNOSIS — K219 Gastro-esophageal reflux disease without esophagitis: Secondary | ICD-10-CM | POA: Diagnosis not present

## 2021-02-07 DIAGNOSIS — J3089 Other allergic rhinitis: Secondary | ICD-10-CM | POA: Diagnosis not present

## 2021-02-07 MED ORDER — EPINEPHRINE 0.3 MG/0.3ML IJ SOAJ: INTRAMUSCULAR | 2 refills | Status: DC

## 2021-02-07 NOTE — Patient Instructions (Addendum)
Asthma Continue Advair 230-2 puffs twice a day with a spacer to prevent cough or wheeze Continue albuterol 2 puffs every 4 hours as needed for cough or wheeze OR Instead use albuterol 0.083% solution via nebulizer one unit vial every 4 hours as needed for cough or wheeze You may use albuterol 2 puffs 5 to 15 minutes before activity to decrease cough or wheeze Continue Xolair 300 mg injection once every 28 days and have access to an epinephrine autoinjector set We have ordered one lab to help [place] evaluate the best treatment for your asthma  Allergic rhinitis Continue avoidance measures directed toward mold as listed below Continue Flonase 2 sprays in each nostril once a day as needed for stuffy nose For thick postnasal drainage, begin Mucinex 600 to 1200 mg twice a day as needed Consider saline nasal rinses as needed for nasal symptoms. Use this before any medicated nasal sprays for best result  Reflux Continue dietary and lifestyle modifications as listed below Continue famotidine 20 mg to twice a day to control reflux  Call the clinic if this treatment plan is not working well for you  Follow up in 6 months or sooner if needed.  Control of Mold Allergen Mold and fungi can grow on a variety of surfaces provided certain temperature and moisture conditions exist.  Outdoor molds grow on plants, decaying vegetation and soil.  The major outdoor mold, Alternaria and Cladosporium, are found in very high numbers during hot and dry conditions.  Generally, a late Summer - Fall peak is seen for common outdoor fungal spores.  Rain will temporarily lower outdoor mold spore count, but counts rise rapidly when the rainy period ends.  The most important indoor molds are Aspergillus and Penicillium.  Dark, humid and poorly ventilated basements are ideal sites for mold growth.  The next most common sites of mold growth are the bathroom and the kitchen.  Outdoor [employer] Use air conditioning and keep  windows closed Avoid exposure to decaying vegetation. Avoid leaf raking. Avoid grain handling. Consider wearing a face mask if working in moldy areas.  Indoor Mold Control Maintain humidity below 50%. Clean washable surfaces with 5% bleach solution. Remove sources e.g. Contaminated carpets.    Lifestyle Changes for Controlling GERD When you have GERD, stomach acid feels as if it's backing up toward your mouth. Whether or not you take medication to control your GERD, your symptoms can often be improved with lifestyle changes.   Raise Your Head Reflux is more likely to strike when you're lying down flat, because stomach fluid can flow backward more easily. Raising the head of your bed 4-6 inches can help. To do this: Slide blocks or books under the legs at the head of your bed. Or, place a wedge under the mattress. Many foam stores can make a suitable wedge for you. The wedge should run from your waist to the top of your head. Don't just prop your head on several pillows. This increases pressure on your stomach. It can make GERD worse.  Watch Your Eating Habits Certain foods may increase the acid in your stomach or relax the lower esophageal sphincter, making GERD more likely. It's best to avoid the following: Coffee, tea, and carbonated drinks (with and without caffeine) Fatty, fried, or spicy food Mint, chocolate, onions, and tomatoes Any other foods that seem to irritate your stomach or cause you pain  Relieve the Pressure Eat smaller meals, even if you have to eat more often. Don't lie down  right after you eat. Wait a few hours for your stomach to empty. Avoid tight belts and tight-fitting clothes. Lose excess weight.  Tobacco and Alcohol Avoid smoking tobacco and drinking alcohol. They can make GERD symptoms worse.

## 2021-02-07 NOTE — Progress Notes (Signed)
[address] [place] [address] Dept: [phone]  FOLLOW UP NOTE  Patient ID: Susan Lyons, female    DOB: Jul 23, 1955  Age: 65 y.o. MRN: [id number] Date of Office Visit: 02/07/2021  Assessment  Chief Complaint: Asthma  HPI Susan Lyons is a 65 year old female who presents to the clinic for follow-up visit.  She was last seen in this clinic on 10/11/2020 for evaluation of asthma, allergic rhinitis, cough, and reflux.  At today's visit, she reports her asthma has been moderately well controlled with occasional shortness of breath with activity especially when the weather is hot and humid.  She denies shortness of breath or wheeze with rest.  She reports that she continues to experience cough producing clear clear mucus for the last 8 or 9 years.  She continues Advair 230-2 puffs twice a day and has not needed to use her albuterol in about 6 months.  She continues Xolair 300 mg once every 28 days with no large or local reactions.  She reports a significant decrease in her symptoms of asthma while continuing on Xolair.  She reports that she had tongue swelling and itch with the use of Spiriva.  Allergic rhinitis is reported as moderately well controlled with symptoms including frequent sneezing and copious postnasal drainage with frequent throat clearing.  She continues Claritin 10 mg once a day and Flonase 1 spray in each nostril twice a day.  She reports excellent Flonase application technique.  Reflux is reported as well controlled with famotidine twice a day.  Her current medications are listed in the chart.   Drug Allergies:  Allergies  Allergen Reactions   Sulfa Antibiotics Rash and Hives   Avelox [Moxifloxacin Hcl In Nacl] Nausea Only   Spiriva Handihaler [Tiotropium Bromide Monohydrate] Swelling   Tiotropium Swelling    Physical Exam: BP 130/70   Pulse 75   Resp 20   SpO2 97%    Physical Exam Vitals reviewed.  Constitutional:      Appearance: Normal appearance.   HENT:     Head: Normocephalic and atraumatic.     Right Ear: Tympanic membrane normal.     Left Ear: Tympanic membrane normal.     Nose:     Comments: Bilateral naris slightly erythematous with clear nasal drainage noted.  Pharynx normal.  Ears normal.  Eyes normal.    Mouth/Throat:     Pharynx: Oropharynx is clear.  Eyes:     Conjunctiva/sclera: Conjunctivae normal.  Cardiovascular:     Rate and Rhythm: Normal rate and regular rhythm.     Heart sounds: Normal heart sounds. No murmur heard. Pulmonary:     Effort: Pulmonary effort is normal.     Breath sounds: Normal breath sounds.     Comments: Lungs clear to auscultation Musculoskeletal:        General: Normal range of motion.     Cervical back: Normal range of motion and neck supple.  Skin:    General: Skin is warm and dry.  Neurological:     Mental Status: She is alert and oriented to person, place, and time.  Psychiatric:        Mood and Affect: Mood normal.        Behavior: Behavior normal.        Thought Content: Thought content normal.        Judgment: Judgment normal.    Diagnostics: FVC 1.75, FEV1 1.33.  Predicted FVC 2.55, predicted FEV1 2.00.  Spirometry indicates moderate restriction.  This is consistent with previous spirometry readings.  Assessment and Plan: 1. Moderate persistent asthma without complication   2. Gastroesophageal reflux disease, unspecified whether esophagitis present   3. Other allergic rhinitis     Meds ordered this encounter  Medications   EPINEPHrine (EPIPEN 2-PAK) 0.3 mg/0.3 mL IJ SOAJ injection    Sig: USE AS DIRECTED FOR SEVERE ALLERGIC REACTION    Dispense:  1 each    Refill:  2    DISPENSE MYLAN BRAND OR MYLAN GENERIC EQUIVALENT ONLY.     Patient Instructions  Asthma Continue Advair 230-2 puffs twice a day with a spacer to prevent cough or wheeze Continue albuterol 2 puffs every 4 hours as needed for cough or wheeze OR Instead use albuterol 0.083% solution via nebulizer  one unit vial every 4 hours as needed for cough or wheeze You may use albuterol 2 puffs 5 to 15 minutes before activity to decrease cough or wheeze Continue Xolair 300 mg injection once every 28 days and have access to an epinephrine autoinjector set We have ordered one lab to help [place] evaluate the best treatment for your asthma  Allergic rhinitis Continue avoidance measures directed toward mold as listed below Continue Flonase 2 sprays in each nostril once a day as needed for stuffy nose For thick postnasal drainage, begin Mucinex 600 to 1200 mg twice a day as needed Consider saline nasal rinses as needed for nasal symptoms. Use this before any medicated nasal sprays for best result  Reflux Continue dietary and lifestyle modifications as listed below Continue famotidine 20 mg to twice a day to control reflux  Call the clinic if this treatment plan is not working well for you  Follow up in 6 months or sooner if needed.   Return in about 6 months (around 08/10/2021), or if symptoms worsen or fail to improve.    Thank you for the opportunity to care for this patient.  Please do not hesitate to contact me with questions.  Thermon Leyland, FNP [facility]

## 2021-02-28 ENCOUNTER — Other Ambulatory Visit: Payer: Self-pay

## 2021-02-28 DIAGNOSIS — J3089 Other allergic rhinitis: Secondary | ICD-10-CM

## 2021-02-28 MED ORDER — FLUTICASONE PROPIONATE 50 MCG/ACT NA SUSP: 1.0000 | Freq: Two times a day (BID) | NASAL | 5 refills | Status: DC

## 2021-03-01 DIAGNOSIS — J4540 Moderate persistent asthma, uncomplicated: Secondary | ICD-10-CM | POA: Diagnosis not present

## 2021-03-02 ENCOUNTER — Other Ambulatory Visit: Payer: Self-pay

## 2021-03-02 ENCOUNTER — Ambulatory Visit (INDEPENDENT_AMBULATORY_CARE_PROVIDER_SITE_OTHER): Payer: Medicare Other

## 2021-03-02 DIAGNOSIS — J4540 Moderate persistent asthma, uncomplicated: Secondary | ICD-10-CM | POA: Diagnosis not present

## 2021-03-03 ENCOUNTER — Other Ambulatory Visit: Payer: Self-pay | Admitting: Family Medicine

## 2021-03-29 DIAGNOSIS — J4540 Moderate persistent asthma, uncomplicated: Secondary | ICD-10-CM

## 2021-03-30 ENCOUNTER — Ambulatory Visit (INDEPENDENT_AMBULATORY_CARE_PROVIDER_SITE_OTHER): Payer: Medicare Other

## 2021-03-30 ENCOUNTER — Other Ambulatory Visit: Payer: Self-pay

## 2021-03-30 DIAGNOSIS — J4540 Moderate persistent asthma, uncomplicated: Secondary | ICD-10-CM | POA: Diagnosis not present

## 2021-04-12 ENCOUNTER — Other Ambulatory Visit: Payer: Self-pay | Admitting: Family Medicine

## 2021-04-27 ENCOUNTER — Other Ambulatory Visit: Payer: Self-pay

## 2021-04-27 ENCOUNTER — Ambulatory Visit (INDEPENDENT_AMBULATORY_CARE_PROVIDER_SITE_OTHER): Payer: Medicare Other

## 2021-04-27 DIAGNOSIS — J4540 Moderate persistent asthma, uncomplicated: Secondary | ICD-10-CM

## 2021-05-24 DIAGNOSIS — J4540 Moderate persistent asthma, uncomplicated: Secondary | ICD-10-CM

## 2021-05-25 ENCOUNTER — Ambulatory Visit (INDEPENDENT_AMBULATORY_CARE_PROVIDER_SITE_OTHER): Payer: Medicare Other | Admitting: *Deleted

## 2021-05-25 DIAGNOSIS — J4540 Moderate persistent asthma, uncomplicated: Secondary | ICD-10-CM | POA: Diagnosis not present

## 2021-06-21 DIAGNOSIS — J4540 Moderate persistent asthma, uncomplicated: Secondary | ICD-10-CM | POA: Diagnosis not present

## 2021-06-22 ENCOUNTER — Ambulatory Visit (INDEPENDENT_AMBULATORY_CARE_PROVIDER_SITE_OTHER): Payer: Medicare Other | Admitting: *Deleted

## 2021-06-22 DIAGNOSIS — J4540 Moderate persistent asthma, uncomplicated: Secondary | ICD-10-CM

## 2021-07-05 ENCOUNTER — Ambulatory Visit: Payer: Medicare Other

## 2021-07-19 DIAGNOSIS — J4540 Moderate persistent asthma, uncomplicated: Secondary | ICD-10-CM

## 2021-07-20 ENCOUNTER — Other Ambulatory Visit: Payer: Self-pay

## 2021-07-20 ENCOUNTER — Ambulatory Visit (INDEPENDENT_AMBULATORY_CARE_PROVIDER_SITE_OTHER): Payer: Medicare Other

## 2021-07-20 DIAGNOSIS — J4540 Moderate persistent asthma, uncomplicated: Secondary | ICD-10-CM | POA: Diagnosis not present

## 2021-08-15 DIAGNOSIS — J4540 Moderate persistent asthma, uncomplicated: Secondary | ICD-10-CM

## 2021-08-16 ENCOUNTER — Ambulatory Visit (INDEPENDENT_AMBULATORY_CARE_PROVIDER_SITE_OTHER): Payer: Medicare Other

## 2021-08-16 ENCOUNTER — Other Ambulatory Visit: Payer: Self-pay

## 2021-08-16 DIAGNOSIS — J4540 Moderate persistent asthma, uncomplicated: Secondary | ICD-10-CM | POA: Diagnosis not present

## 2021-08-31 ENCOUNTER — Ambulatory Visit (INDEPENDENT_AMBULATORY_CARE_PROVIDER_SITE_OTHER): Payer: Medicare Other | Admitting: Family Medicine

## 2021-08-31 ENCOUNTER — Other Ambulatory Visit: Payer: Self-pay

## 2021-08-31 ENCOUNTER — Encounter: Payer: Self-pay | Admitting: Family Medicine

## 2021-08-31 VITALS — BP 140/76 | HR 71 | Temp 98.2°F | Resp 17 | Ht 60.0 in | Wt 160.0 lb

## 2021-08-31 DIAGNOSIS — J302 Other seasonal allergic rhinitis: Secondary | ICD-10-CM

## 2021-08-31 DIAGNOSIS — J3089 Other allergic rhinitis: Secondary | ICD-10-CM

## 2021-08-31 DIAGNOSIS — J4540 Moderate persistent asthma, uncomplicated: Secondary | ICD-10-CM

## 2021-08-31 DIAGNOSIS — K219 Gastro-esophageal reflux disease without esophagitis: Secondary | ICD-10-CM | POA: Diagnosis not present

## 2021-08-31 MED ORDER — LORATADINE 10 MG PO TABS: 10.0000 mg | ORAL_TABLET | Freq: Every day | ORAL | 5 refills | Status: DC

## 2021-08-31 MED ORDER — ALBUTEROL SULFATE HFA 108 (90 BASE) MCG/ACT IN AERS: 1.0000 | INHALATION_SPRAY | RESPIRATORY_TRACT | 1 refills | Status: DC | PRN

## 2021-08-31 MED ORDER — FLUTICASONE-SALMETEROL 250-50 MCG/ACT IN AEPB: 1.0000 | INHALATION_SPRAY | Freq: Two times a day (BID) | RESPIRATORY_TRACT | 3 refills | Status: DC

## 2021-08-31 NOTE — Progress Notes (Signed)
? ?[place] [id number] ?Dept: [phone] ? ?FOLLOW UP NOTE ? ?Patient ID: Susan Lyons, female    DOB: October 18, 1955  Age: 66 y.o. MRN: [id number] ?Date of Office Visit: 08/31/2021 ? ?Assessment  ?Chief Complaint: Follow-up (Pt states she have been doing well, occasional cough, clearing of throat.) ? ?HPI ?Susan Lyons is a 66 year old female who presents to the clinic for follow-up visit.  She was last seen in this clinic on 02/07/2021 by Gareth Morgan, FNP, for evaluation of asthma, allergic rhinitis, and reflux.  At today's visit, she reports her asthma has been moderately well controlled with symptoms including occasional shortness of breath mostly with activity such as cleaning as well as cough producing clear thin mucus in order to clear her throat.  She reports that this frequently feels like a "film" covering her throat.  She continues Advair discus 250-1 puff twice a day and rarely uses albuterol.  She continues Xolair injections once every 4 weeks with no large or local reactions.  She reports a significant decrease in her symptoms of asthma while continuing on Xolair.  Allergic rhinitis is reported as moderately well controlled with symptoms including nasal congestion most frequently occurring in the left nare and sneezing occurring mostly in the mornings.  She continues Flonase 1 spray in each nostril twice a day and saline nasal rinses with relief of symptoms.  Reflux is reported as moderately well controlled with symptoms of heartburn occurring occasionally especially after eating foods such as tomato and chocolate.  She continues famotidine 20 mg in the morning and occasionally takes Tums at night with relief of symptoms.  Her current medications are listed in the chart. ?Of note, she had an absolute eosinophil count of 0.6 on 03/16/2021 ? ? ?Drug Allergies:  ?Allergies  ?Allergen Reactions  ? Sulfa Antibiotics Rash and Hives  ? Avelox [Moxifloxacin Hcl In Nacl] Nausea Only  ? Spiriva  Handihaler [Tiotropium Bromide Monohydrate] Swelling  ? Tiotropium Swelling  ? ? ?Physical Exam: ?BP 140/76   Pulse 71   Temp 98.2 ?F (36.8 ?C) (Temporal)   Resp 17   Ht 5' (1.524 m)   Wt 160 lb (72.6 kg)   SpO2 96%   BMI 31.25 kg/m?   ? ?Physical Exam ?Vitals reviewed.  ?Constitutional:   ?   Appearance: Normal appearance.  ?HENT:  ?   Head: Normocephalic and atraumatic.  ?   Right Ear: Tympanic membrane normal.  ?   Left Ear: Tympanic membrane normal.  ?   Nose:  ?   Comments: Bilateral nares slightly erythematous with clear nasal drainage noted.  Pharynx normal.  Ears normal.  Eyes normal. ?   Mouth/Throat:  ?   Pharynx: Oropharynx is clear.  ?Eyes:  ?   Conjunctiva/sclera: Conjunctivae normal.  ?Cardiovascular:  ?   Rate and Rhythm: Normal rate and regular rhythm.  ?   Heart sounds: Normal heart sounds. No murmur heard. ?Pulmonary:  ?   Effort: Pulmonary effort is normal.  ?   Breath sounds: Normal breath sounds.  ?   Comments: Lungs clear to auscultation ?Musculoskeletal:     ?   General: Normal range of motion.  ?   Cervical back: Normal range of motion and neck supple.  ?Skin: ?   General: Skin is warm and dry.  ?Neurological:  ?   Mental Status: She is alert and oriented to person, place, and time.  ?Psychiatric:     ?   Mood and  Affect: Mood normal.     ?   Behavior: Behavior normal.     ?   Thought Content: Thought content normal.     ?   Judgment: Judgment normal.  ? ? ?Diagnostics: ?FVC 1.82, FEV1 1.38.  Predicted FVC 2.53, predicted FEV1 1.99.  Spirometry indicates possible moderate restriction.  This is consistent with previous spirometry readings. ? ?Assessment and Plan: ?1. Moderate persistent asthma without complication   ?2. Seasonal and perennial allergic rhinitis   ?3. Gastroesophageal reflux disease, unspecified whether esophagitis present   ? ? ?Meds ordered this encounter  ?Medications  ? albuterol (PROAIR HFA) 108 (90 Base) MCG/ACT inhaler  ?  Sig: Inhale 1-2 puffs into the lungs every  4 (four) hours as needed for wheezing or shortness of breath.  ?  Dispense:  6.7 g  ?  Refill:  1  ? loratadine (CLARITIN) 10 MG tablet  ?  Sig: Take 1 tablet (10 mg total) by mouth daily.  ?  Dispense:  30 tablet  ?  Refill:  5  ? fluticasone-salmeterol (ADVAIR DISKUS) 250-50 MCG/ACT AEPB  ?  Sig: Inhale 1 puff into the lungs in the morning and at bedtime.  ?  Dispense:  60 each  ?  Refill:  3  ? ? ?Patient Instructions  ?Asthma ?Continue Advair 250-1 puff twice a day with a spacer to prevent cough or wheeze ?Continue albuterol 2 puffs every 4 hours as needed for cough or wheeze OR Instead use albuterol 0.083% solution via nebulizer one unit vial every 4 hours as needed for cough or wheeze ?You may use albuterol 2 puffs 5 to 15 minutes before activity to decrease cough or wheeze ?Continue Xolair 300 mg injection once every 28 days and have access to an epinephrine autoinjector set ? ?Allergic rhinitis ?Continue avoidance measures directed toward mold as listed below ?Continue Flonase 2 sprays in each nostril once a day as needed for stuffy nose ?For thick postnasal drainage, begin Mucinex 600 to 1200 mg twice a day as needed ?Consider saline nasal rinses as needed for nasal symptoms. Use this before any medicated nasal sprays for best result ? ?Reflux ?Continue dietary and lifestyle modifications as listed below ?Continue famotidine 20 mg once or twice a day to control reflux ? ?Call the clinic if this treatment plan is not working well for you ? ?Follow up in 6 months or sooner if needed. ? ? ?Return in about 6 months (around 03/03/2022), or if symptoms worsen or fail to improve. ?  ? ?Thank you for the opportunity to care for this patient.  Please do not hesitate to contact me with questions. ? ?Gareth Morgan, FNP ?[facility] ? ? ? ? ? ?

## 2021-08-31 NOTE — Patient Instructions (Addendum)
Asthma ?Continue Advair 250-1 puff twice a day with a spacer to prevent cough or wheeze ?Continue albuterol 2 puffs every 4 hours as needed for cough or wheeze OR Instead use albuterol 0.083% solution via nebulizer one unit vial every 4 hours as needed for cough or wheeze ?You may use albuterol 2 puffs 5 to 15 minutes before activity to decrease cough or wheeze ?Continue Xolair 300 mg injection once every 28 days and have access to an epinephrine autoinjector set ? ?Allergic rhinitis ?Continue avoidance measures directed toward mold as listed below ?Continue Flonase 2 sprays in each nostril once a day as needed for stuffy nose ?For thick postnasal drainage, begin Mucinex 600 to 1200 mg twice a day as needed ?Consider saline nasal rinses as needed for nasal symptoms. Use this before any medicated nasal sprays for best result ? ?Reflux ?Continue dietary and lifestyle modifications as listed below ?Continue famotidine 20 mg once or twice a day to control reflux ? ?Call the clinic if this treatment plan is not working well for you ? ?Follow up in 6 months or sooner if needed. ? ?Control of Mold Allergen ?Mold and fungi can grow on a variety of surfaces provided certain temperature and moisture conditions exist.  Outdoor molds grow on plants, decaying vegetation and soil.  The major outdoor mold, Alternaria and Cladosporium, are found in very high numbers during hot and dry conditions.  Generally, a late Summer - Fall peak is seen for common outdoor fungal spores.  Rain will temporarily lower outdoor mold spore count, but counts rise rapidly when the rainy period ends.  The most important indoor molds are Aspergillus and Penicillium.  Dark, humid and poorly ventilated basements are ideal sites for mold growth.  The next most common sites of mold growth are the bathroom and the kitchen. ? ?Outdoor [employer] ?Use air conditioning and keep windows closed ?Avoid exposure to decaying vegetation. ?Avoid leaf raking. ?Avoid  grain handling. ?Consider wearing a face mask if working in moldy areas. ? ?Indoor Mold Control ?Maintain humidity below 50%. ?Clean washable surfaces with 5% bleach solution. ?Remove sources e.g. Contaminated carpets. ? ? ? ?Lifestyle Changes for Controlling GERD ?When you have GERD, stomach acid feels as if it?s backing up toward your mouth. ?Whether or not you take medication to control your GERD, your symptoms can often be ?improved with lifestyle changes.  ? ?Raise Your Head ?Reflux is more likely to strike when you?re lying down flat, because stomach fluid can ?flow backward more easily. Raising the head of your bed 4-6 inches can help. To do this: ?Slide blocks or books under the legs at the head of your bed. Or, place a wedge under ?the mattress. Many foam stores can make a suitable wedge for you. The wedge ?should run from your waist to the top of your head. ?Don?t just prop your head on several pillows. This increases pressure on your ?stomach. It can make GERD worse. ? ?Watch Your Eating Habits ?Certain foods may increase the acid in your stomach or relax the lower esophageal ?sphincter, making GERD more likely. It?s best to avoid the following: ?Coffee, tea, and carbonated drinks (with and without caffeine) ?Fatty, fried, or spicy food ?Mint, chocolate, onions, and tomatoes ?Any other foods that seem to irritate your stomach or cause you pain ? ?Relieve the Pressure ?Eat smaller meals, even if you have to eat more often. ?Don?t lie down right after you eat. Wait a few hours for your stomach to empty. ?Avoid  tight belts and tight-fitting clothes. ?Lose excess weight. ? ?Tobacco and Alcohol ?Avoid smoking tobacco and drinking alcohol. They can make GERD symptoms worse. ? ?

## 2021-09-12 DIAGNOSIS — J4540 Moderate persistent asthma, uncomplicated: Secondary | ICD-10-CM | POA: Diagnosis not present

## 2021-09-13 ENCOUNTER — Ambulatory Visit (INDEPENDENT_AMBULATORY_CARE_PROVIDER_SITE_OTHER): Payer: Medicare Other

## 2021-09-13 ENCOUNTER — Other Ambulatory Visit: Payer: Self-pay

## 2021-09-13 DIAGNOSIS — J4540 Moderate persistent asthma, uncomplicated: Secondary | ICD-10-CM | POA: Diagnosis not present

## 2021-10-02 ENCOUNTER — Other Ambulatory Visit: Payer: Self-pay | Admitting: Allergy

## 2021-10-02 NOTE — Progress Notes (Unsigned)
Called by pt who states she has had vertigo since Wednesday and repots that the room spins when she lays down.  She has meclizine (although I do not see this on her medication list) that isn't helping.  She states Dr. Lucie Leather has prescribed her meclizine in the past.   ?I advised pt that vertigo is most often not an allergy driven process and that positional vertigo that she is describing is not allergy driven thus I have no recommendations I can provide other than going to Urgent care this weekend for other treatment options and/or discussing this with her PCP.  She understood these recommendations.  ?

## 2021-10-11 ENCOUNTER — Ambulatory Visit: Payer: Medicare Other

## 2021-10-11 DIAGNOSIS — J4540 Moderate persistent asthma, uncomplicated: Secondary | ICD-10-CM

## 2021-10-12 ENCOUNTER — Ambulatory Visit (INDEPENDENT_AMBULATORY_CARE_PROVIDER_SITE_OTHER): Payer: Medicare Other

## 2021-10-12 DIAGNOSIS — J4540 Moderate persistent asthma, uncomplicated: Secondary | ICD-10-CM

## 2021-11-08 DIAGNOSIS — J4540 Moderate persistent asthma, uncomplicated: Secondary | ICD-10-CM | POA: Diagnosis not present

## 2021-11-09 ENCOUNTER — Ambulatory Visit (INDEPENDENT_AMBULATORY_CARE_PROVIDER_SITE_OTHER): Payer: Medicare Other

## 2021-11-09 DIAGNOSIS — J4540 Moderate persistent asthma, uncomplicated: Secondary | ICD-10-CM | POA: Diagnosis not present

## 2021-12-06 DIAGNOSIS — J4540 Moderate persistent asthma, uncomplicated: Secondary | ICD-10-CM

## 2021-12-07 ENCOUNTER — Ambulatory Visit (INDEPENDENT_AMBULATORY_CARE_PROVIDER_SITE_OTHER): Payer: Medicare Other

## 2021-12-07 DIAGNOSIS — J4540 Moderate persistent asthma, uncomplicated: Secondary | ICD-10-CM

## 2021-12-08 ENCOUNTER — Ambulatory Visit: Payer: Medicare Other

## 2021-12-23 IMAGING — DX DG CHEST 2V
2 series · 2 of 2 positions shown · non-contrast
Comparison: None.

CLINICAL DATA: Increasing cough

EXAM:
CHEST - 2 VIEW

[chest pa]
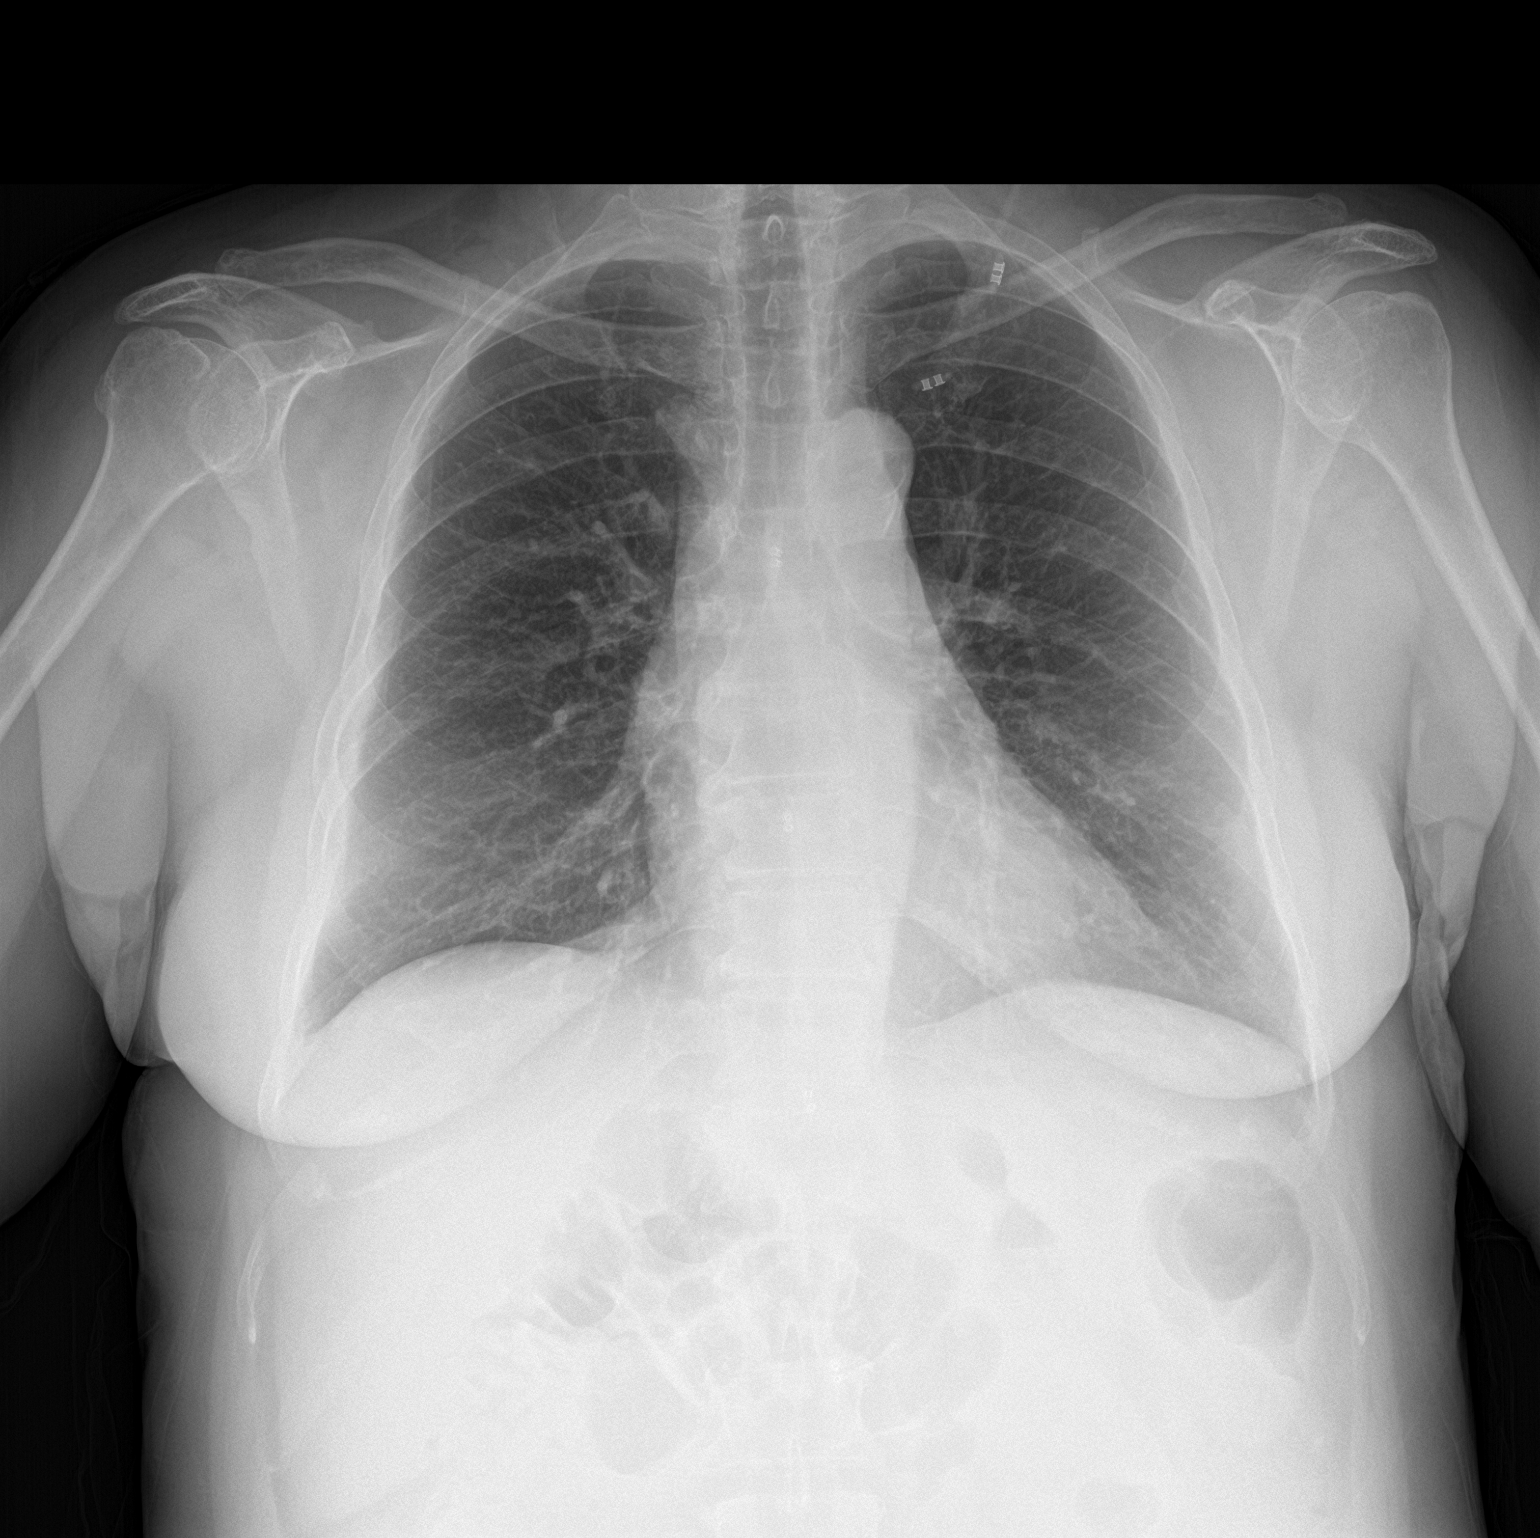

[chest lat]
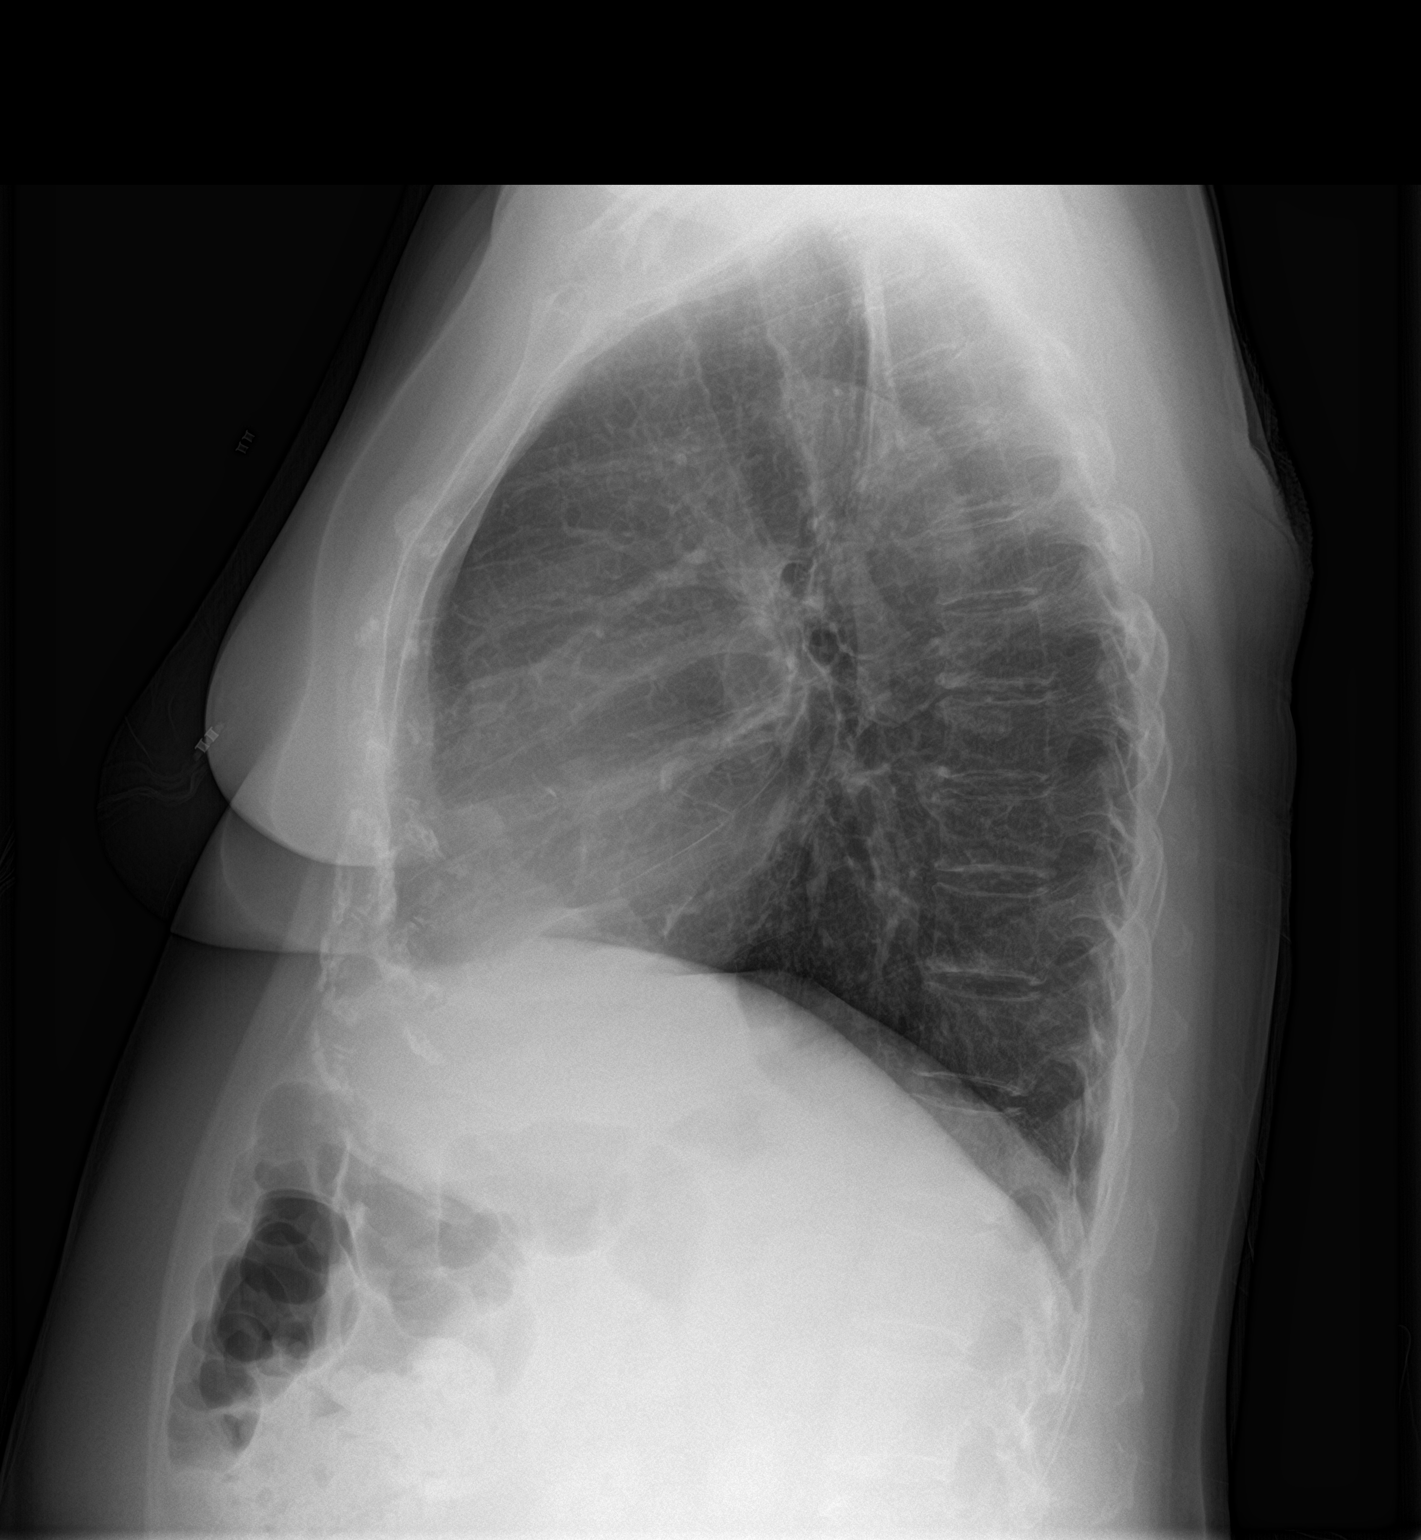

[2 of 2 positions shown; findings below may reference images not displayed]

FINDINGS: Cardiac shadow is within normal limits. Aortic calcifications are
noted. Lungs are well aerated without focal infiltrate or effusion.
Mild degenerative changes of the thoracic spine are noted.
IMPRESSION: No acute abnormality noted.

## 2022-01-04 ENCOUNTER — Ambulatory Visit (INDEPENDENT_AMBULATORY_CARE_PROVIDER_SITE_OTHER): Payer: Medicare Other

## 2022-01-04 DIAGNOSIS — J4540 Moderate persistent asthma, uncomplicated: Secondary | ICD-10-CM

## 2022-02-01 ENCOUNTER — Ambulatory Visit: Payer: Medicare Other

## 2022-02-03 DIAGNOSIS — J4540 Moderate persistent asthma, uncomplicated: Secondary | ICD-10-CM

## 2022-02-06 ENCOUNTER — Ambulatory Visit (INDEPENDENT_AMBULATORY_CARE_PROVIDER_SITE_OTHER): Payer: Medicare Other

## 2022-02-06 DIAGNOSIS — J4540 Moderate persistent asthma, uncomplicated: Secondary | ICD-10-CM | POA: Diagnosis not present

## 2022-03-03 ENCOUNTER — Ambulatory Visit: Payer: Medicare Other | Admitting: Internal Medicine

## 2022-03-07 ENCOUNTER — Ambulatory Visit: Payer: Medicare Other

## 2022-03-07 DIAGNOSIS — J4540 Moderate persistent asthma, uncomplicated: Secondary | ICD-10-CM

## 2022-03-07 NOTE — Progress Notes (Unsigned)
FOLLOW UP Date of Service/Encounter:  03/08/22   Subjective:  Susan Lyons (DOB: 1955-08-24) is a 66 y.o. female who [facility] in re-evaluation of the following: asthma on Xolair, allergic rhinitis, and reflux.  History obtained from: chart review and patient.  For Review, LV was on 08/31/21  with Thermon Leyland, FNP seen for routine follow-up. FEV1 1.38 L at that visit.  Overall she was controlled and continued on current management plan including with Xolair.  Today presents for follow-up. She does not have any issues with her Xolair.  She is tolerating these injections quite well and feels that her asthma is controlled.  She has upcoming surgery for carpal tunnel release this Friday.  She is due for Xolair injection, but was going to wait until after her surgery. When she uses her flonase, it opens up her sinuses and she gets a lot of post nasal drip. This happens occasionally. She has been taking a tums at night, because her famotidine is not controlling her symptoms. She was taken off prilosec due to issues with bone density. Last Xolair injection on 02/06/22.  ACT 22 out of 25 today   Allergies as of 03/08/2022       Reactions   Sulfa Antibiotics Rash, Hives   Avelox [moxifloxacin Hcl In Nacl] Nausea Only   Spiriva Handihaler [tiotropium Bromide Monohydrate] Swelling   Tiotropium Swelling        Medication List        Accurate as of March 08, 2022  1:29 PM. If you have any questions, ask your nurse or doctor.          albuterol (2.5 MG/3ML) 0.083% nebulizer solution Commonly known as: PROVENTIL INHALE CONTENT OF 1 VIAL BY NEBULIZATION EVERY 6 HOURS AS NEEDED FOR WHEEZING OR SHORTNESS OF BREATH   albuterol 108 (90 Base) MCG/ACT inhaler Commonly known as: ProAir HFA Inhale 1-2 puffs into the lungs every 4 (four) hours as needed for wheezing or shortness of breath.   [employer] CALCIUM CITRATE+D3 PO Take 500 mg by mouth.    EPINEPHrine 0.3 mg/0.3 mL Soaj injection Commonly known as: EpiPen 2-Pak USE AS DIRECTED FOR SEVERE ALLERGIC REACTION   famotidine 20 MG tablet Commonly known as: PEPCID Take 1 tablet (20 mg total) by mouth 2 (two) times daily.   famotidine 20 MG tablet Commonly known as: PEPCID Take 1 tablet (20 mg total) by mouth 2 (two) times daily.   fluticasone 50 MCG/ACT nasal spray Commonly known as: Flonase Place 1 spray into both nostrils 2 (two) times daily.   fluticasone-salmeterol 250-50 MCG/ACT Aepb Commonly known as: Advair Diskus Inhale 1 puff into the lungs in the morning and at bedtime.   lisinopril-hydrochlorothiazide 10-12.5 MG tablet Commonly known as: ZESTORETIC Take by mouth.   loratadine 10 MG tablet Commonly known as: CLARITIN Take 1 tablet (10 mg total) by mouth daily.   Osteo Bi-Flex Joint Shield Tabs Take by mouth.   triamcinolone cream 0.1 % Commonly known as: KENALOG Apply 1 application topically as needed.   Xolair 150 MG injection Generic drug: omalizumab INJECT 300 MG SUBCUTANEOUSLY EVERY 4 WEEKS.       Past Medical History:  Diagnosis Date   Allergic rhinitis    Asthma    Hypertension    Osteoporosis    Past Surgical History:  Procedure Laterality Date   APPENDECTOMY     TUBAL LIGATION     UTERINE FIBROID SURGERY  Otherwise, there have been no changes to her past medical history, surgical history, family history, or social history.  ROS: All others negative except as noted per HPI.   Objective:  BP 122/70 (BP Location: Left Arm, Patient Position: Sitting, Cuff Size: Normal)   Pulse 83   Temp 98.2 F (36.8 C) (Temporal)   Resp 18   Wt 162 lb 12.8 oz (73.8 kg)   SpO2 95%   BMI 31.79 kg/m  Body mass index is 31.79 kg/m. Physical Exam: General Appearance:  Alert, cooperative, no distress, appears stated age  Head:  Normocephalic, without obvious abnormality, atraumatic  Eyes:  Conjunctiva clear, EOM's intact  Nose: Nares  normal, hypertrophic turbinates, normal mucosa, no visible anterior polyps, and septum midline  Throat: Lips, tongue normal; teeth and gums normal, normal posterior oropharynx  Neck: Supple, symmetrical  Lungs:   clear to auscultation bilaterally, Respirations unlabored, no coughing  Heart:  regular rate and rhythm and no murmur, Appears well perfused  Extremities: No edema  Skin: Skin color, texture, turgor normal, no rashes or lesions on visualized portions of skin  Neurologic: No gross deficits   Spirometry results: FVC: 1.85L FEV1: 1.42L, 72% predicted  FEV1/FVC ratio: 97%  Interpretation:  Nonobstructive ratio , possible restriction   Assessment/Plan   Asthma-well-controlled, giving Xolair today in preparation for upcoming surgery to prevent asthma flare Continue Advair 250-1 puff twice a day with a spacer to prevent cough or wheeze Continue albuterol 2 puffs every 4 hours as needed for cough or wheeze OR Instead use albuterol 0.083% solution via nebulizer one unit vial every 4 hours as needed for cough or wheeze You may use albuterol 2 puffs 5 to 15 minutes before activity to decrease cough or wheeze Continue Xolair 300 mg injection once every 28 days and have access to an epinephrine autoinjector set  Allergic rhinitis-overall controlled, having some increased drainage suspected due to uncontrolled reflux Continue avoidance measures directed toward mold as listed below Continue Flonase 2 sprays in each nostril once a day as needed for stuffy nose For thick postnasal drainage, begin Mucinex 600 to 1200 mg twice a day as needed Consider saline nasal rinses as needed for nasal symptoms. Use this before any medicated nasal sprays for best result  Reflux-uncontrolled, avoiding PPI due to bone density issues Continue dietary and lifestyle modifications as listed below Increase famotidine to 40 mg twice a day to control reflux Continue Tums 1-2 times a day for reflux control  Call  the clinic if this treatment plan is not working well for you  Follow up in 6 months or sooner if needed. It was a pleasure meeting you in clinic today.  Tonny Bollman, MD  [facility]

## 2022-03-08 ENCOUNTER — Encounter: Payer: Self-pay | Admitting: Internal Medicine

## 2022-03-08 ENCOUNTER — Ambulatory Visit (INDEPENDENT_AMBULATORY_CARE_PROVIDER_SITE_OTHER): Payer: Medicare Other | Admitting: Internal Medicine

## 2022-03-08 VITALS — BP 122/70 | HR 83 | Temp 98.2°F | Resp 18 | Wt 162.8 lb

## 2022-03-08 DIAGNOSIS — J4540 Moderate persistent asthma, uncomplicated: Secondary | ICD-10-CM

## 2022-03-08 DIAGNOSIS — K219 Gastro-esophageal reflux disease without esophagitis: Secondary | ICD-10-CM | POA: Diagnosis not present

## 2022-03-08 DIAGNOSIS — J3089 Other allergic rhinitis: Secondary | ICD-10-CM

## 2022-03-08 MED ORDER — FLUTICASONE-SALMETEROL 250-50 MCG/ACT IN AEPB: 1.0000 | INHALATION_SPRAY | Freq: Two times a day (BID) | RESPIRATORY_TRACT | 5 refills | Status: DC

## 2022-03-08 MED ORDER — EPINEPHRINE 0.3 MG/0.3ML IJ SOAJ: INTRAMUSCULAR | 2 refills | Status: DC

## 2022-03-08 NOTE — Patient Instructions (Addendum)
Asthma Continue Advair 250-1 puff twice a day with a spacer to prevent cough or wheeze Continue albuterol 2 puffs every 4 hours as needed for cough or wheeze OR Instead use albuterol 0.083% solution via nebulizer one unit vial every 4 hours as needed for cough or wheeze You may use albuterol 2 puffs 5 to 15 minutes before activity to decrease cough or wheeze Continue Xolair 300 mg injection once every 28 days and have access to an epinephrine autoinjector set  Allergic rhinitis Continue avoidance measures directed toward mold as listed below Continue Flonase 2 sprays in each nostril once a day as needed for stuffy nose For thick postnasal drainage, begin Mucinex 600 to 1200 mg twice a day as needed Consider saline nasal rinses as needed for nasal symptoms. Use this before any medicated nasal sprays for best result  Reflux Continue dietary and lifestyle modifications as listed below Increase famotidine to 40 mg twice a day to control reflux Continue Tums 1-2 times a day for reflux control  Call the clinic if this treatment plan is not working well for you  Follow up in 6 months or sooner if needed. It was a pleasure meeting you in clinic today. Good luck with your surgery!   Tonny Bollman, MD [facility]   Control of Mold Allergen Mold and fungi can grow on a variety of surfaces provided certain temperature and moisture conditions exist.  Outdoor molds grow on plants, decaying vegetation and soil.  The major outdoor mold, Alternaria and Cladosporium, are found in very high numbers during hot and dry conditions.  Generally, a late Summer - Fall peak is seen for common outdoor fungal spores.  Rain will temporarily lower outdoor mold spore count, but counts rise rapidly when the rainy period ends.  The most important indoor molds are Aspergillus and Penicillium.  Dark, humid and poorly ventilated basements are ideal sites for mold growth.  The next most common sites of mold  growth are the bathroom and the kitchen.  Outdoor [employer] Use air conditioning and keep windows closed Avoid exposure to decaying vegetation. Avoid leaf raking. Avoid grain handling. Consider wearing a face mask if working in moldy areas.  Indoor Mold Control Maintain humidity below 50%. Clean washable surfaces with 5% bleach solution. Remove sources e.g. Contaminated carpets.    Lifestyle Changes for Controlling GERD When you have GERD, stomach acid feels as if it's backing up toward your mouth. Whether or not you take medication to control your GERD, your symptoms can often be improved with lifestyle changes.   Raise Your Head Reflux is more likely to strike when you're lying down flat, because stomach fluid can flow backward more easily. Raising the head of your bed 4-6 inches can help. To do this: Slide blocks or books under the legs at the head of your bed. Or, place a wedge under the mattress. Many foam stores can make a suitable wedge for you. The wedge should run from your waist to the top of your head. Don't just prop your head on several pillows. This increases pressure on your stomach. It can make GERD worse.  Watch Your Eating Habits Certain foods may increase the acid in your stomach or relax the lower esophageal sphincter, making GERD more likely. It's best to avoid the following: Coffee, tea, and carbonated drinks (with and without caffeine) Fatty, fried, or spicy food Mint, chocolate, onions, and tomatoes Any other foods that seem to irritate your stomach or cause  you pain  Relieve the Pressure Eat smaller meals, even if you have to eat more often. Don't lie down right after you eat. Wait a few hours for your stomach to empty. Avoid tight belts and tight-fitting clothes. Lose excess weight.  Tobacco and Alcohol Avoid smoking tobacco and drinking alcohol. They can make GERD symptoms worse.

## 2022-03-14 ENCOUNTER — Ambulatory Visit: Payer: Medicare Other

## 2022-04-04 DIAGNOSIS — J4540 Moderate persistent asthma, uncomplicated: Secondary | ICD-10-CM | POA: Diagnosis not present

## 2022-04-05 ENCOUNTER — Ambulatory Visit (INDEPENDENT_AMBULATORY_CARE_PROVIDER_SITE_OTHER): Payer: Medicare Other

## 2022-04-05 DIAGNOSIS — J4540 Moderate persistent asthma, uncomplicated: Secondary | ICD-10-CM | POA: Diagnosis not present

## 2022-05-02 DIAGNOSIS — J4540 Moderate persistent asthma, uncomplicated: Secondary | ICD-10-CM | POA: Diagnosis not present

## 2022-05-03 ENCOUNTER — Ambulatory Visit (INDEPENDENT_AMBULATORY_CARE_PROVIDER_SITE_OTHER): Payer: Medicare Other

## 2022-05-03 DIAGNOSIS — J4540 Moderate persistent asthma, uncomplicated: Secondary | ICD-10-CM

## 2022-05-30 DIAGNOSIS — J4540 Moderate persistent asthma, uncomplicated: Secondary | ICD-10-CM | POA: Diagnosis not present

## 2022-05-31 ENCOUNTER — Ambulatory Visit (INDEPENDENT_AMBULATORY_CARE_PROVIDER_SITE_OTHER): Payer: Medicare Other

## 2022-05-31 DIAGNOSIS — J4540 Moderate persistent asthma, uncomplicated: Secondary | ICD-10-CM | POA: Diagnosis not present

## 2022-06-28 ENCOUNTER — Ambulatory Visit (INDEPENDENT_AMBULATORY_CARE_PROVIDER_SITE_OTHER): Payer: Medicare Other | Admitting: *Deleted

## 2022-06-28 DIAGNOSIS — J4540 Moderate persistent asthma, uncomplicated: Secondary | ICD-10-CM | POA: Diagnosis not present

## 2022-07-26 ENCOUNTER — Telehealth: Payer: Self-pay | Admitting: Internal Medicine

## 2022-07-26 ENCOUNTER — Ambulatory Visit: Payer: Medicare Other

## 2022-07-26 NOTE — Telephone Encounter (Signed)
She is the one who typed up the price list when she called insurance

## 2022-07-26 NOTE — Telephone Encounter (Signed)
Please advise new replacement medication for Advair 250 mcg. She did bring in a new insurance card today.

## 2022-07-26 NOTE — Telephone Encounter (Signed)
Tried calling pt but its like someone hung up on me

## 2022-07-26 NOTE — Telephone Encounter (Signed)
It sounds like they are all the same price. Can we let her know and if this is the case, I would keep her on the same inhaler since she is controlled.

## 2022-07-26 NOTE — Telephone Encounter (Signed)
Can we find out what her other options are? Ruthe Mannan, Symbicort, AirDuo, Advair HFA, Wixela?

## 2022-07-26 NOTE — Telephone Encounter (Signed)
Patient stating her insurance will no longer cover the RX fluticasone-salmeterol (ADVAIR DISKUS) 250-50 MCG/ACT AEPB [[id number]]  Patient needs another option please advise

## 2022-07-26 NOTE — Telephone Encounter (Signed)
Fluticasoe/salmeterol powder 250/50 45 Generic symbicort is 45 Advair brand 45 Breo 45 Dulera is 45 Wixela 250/50 is 45

## 2022-07-28 NOTE — Telephone Encounter (Signed)
Lm for pt to call us back  

## 2022-07-31 MED ORDER — FLUTICASONE-SALMETEROL 250-50 MCG/ACT IN AEPB: 1.0000 | INHALATION_SPRAY | Freq: Two times a day (BID) | RESPIRATORY_TRACT | 5 refills | Status: DC

## 2022-07-31 NOTE — Telephone Encounter (Signed)
Patient called wanting to know what inhaler she should use since her insurance will not cover the Advair Diskus. She stated the pharmacy said Grant Ruts would probably be her best option.  Would you like me to send in [place] to the pharmacy, and what would be the strength & directions for taking?   Rheta [phone]

## 2022-07-31 NOTE — Addendum Note (Signed)
Addended by: Isabel Caprice on: 07/31/2022 01:09 PM   Modules accepted: Orders

## 2022-08-01 ENCOUNTER — Ambulatory Visit (INDEPENDENT_AMBULATORY_CARE_PROVIDER_SITE_OTHER): Payer: Medicare Other

## 2022-08-01 DIAGNOSIS — J4540 Moderate persistent asthma, uncomplicated: Secondary | ICD-10-CM | POA: Diagnosis not present

## 2022-08-29 ENCOUNTER — Ambulatory Visit (INDEPENDENT_AMBULATORY_CARE_PROVIDER_SITE_OTHER): Payer: Medicare Other

## 2022-08-29 DIAGNOSIS — J4540 Moderate persistent asthma, uncomplicated: Secondary | ICD-10-CM | POA: Diagnosis not present

## 2022-09-05 NOTE — Progress Notes (Unsigned)
FOLLOW UP Date of Service/Encounter:  09/07/22  Subjective:  Susan Lyons (DOB: Nov 15, 1955) is a 67 y.o. female who returns to the [facility] on 09/07/2022 in re-evaluation of the following:  asthma on Xolair, allergic rhinitis, and reflux.   History obtained from: chart review and patient.  For Review, LV was on 03/08/22  with Dr.Taneisha Fuson seen for routine follow-up. Doing well on Xolair. She had stopped her priolosec due to low bone density, but her reflux was not controlled. FEV1 72%. ACT 22/25.  Today presents for follow-up. She was with her grandson recently who was not feeling well.  She then developed constant drainage and significant tonsillar swelling. Tonsillar/sore throat swelling has improved, but she continues with copious drainage.  She has been using rescue inhaler over the past few days. She feels very congested and tight.  She has not been evaluated for these symptoms. She has not had any antibiotics since last visit.  She did have a steroid injection for her hand, but none for breathing.  She had not used albuterol prior to current illness. She denies fevers. She has not started mucinex yet, using flonase as needed.  She continues her wixela as prescribed. Tolerating Xolair. Had been very well controlled until recent illness.  Her last Xolair injection was 08/29/22.   Allergies as of 09/07/2022       Reactions   Sulfa Antibiotics Rash, Hives   Avelox [moxifloxacin Hcl In Nacl] Nausea Only   Spiriva Handihaler [tiotropium Bromide Monohydrate] Swelling   Tiotropium Swelling        Medication List        Accurate as of September 07, 2022 10:10 AM. If you have any questions, ask your nurse or doctor.          albuterol (2.5 MG/3ML) 0.083% nebulizer solution Commonly known as: PROVENTIL INHALE CONTENT OF 1 VIAL BY NEBULIZATION EVERY 6 HOURS AS NEEDED FOR WHEEZING OR SHORTNESS OF BREATH   albuterol 108 (90 Base) MCG/ACT inhaler Commonly known as:  ProAir HFA Inhale 1-2 puffs into the lungs every 4 (four) hours as needed for wheezing or shortness of breath.   amoxicillin-clavulanate 875-125 MG tablet Commonly known as: AUGMENTIN Take 1 tablet by mouth 2 (two) times daily for 10 days.   [employer] CALCIUM CITRATE+D3 PO Take 500 mg by mouth.   EPINEPHrine 0.3 mg/0.3 mL Soaj injection Commonly known as: EpiPen 2-Pak USE AS DIRECTED FOR SEVERE ALLERGIC REACTION   famotidine 20 MG tablet Commonly known as: PEPCID Take 1 tablet (20 mg total) by mouth 2 (two) times daily. What changed: Another medication with the same name was removed. Continue taking this medication, and follow the directions you see here.   fluconazole 150 MG tablet Commonly known as: DIFLUCAN Take 1 tablet if develops thrush, in 3 days, take a second tablet.   fluticasone 50 MCG/ACT nasal spray Commonly known as: Flonase Place 1 spray into both nostrils 2 (two) times daily.   fluticasone-salmeterol 250-50 MCG/ACT Aepb Commonly known as: Wixela Inhub Inhale 1 puff into the lungs in the morning and at bedtime. What changed: Another medication with the same name was removed. Continue taking this medication, and follow the directions you see here.   lisinopril-hydrochlorothiazide 10-12.5 MG tablet Commonly known as: ZESTORETIC Take by mouth.   loratadine 10 MG tablet Commonly known as: CLARITIN Take 1 tablet (10 mg total) by mouth daily.   Osteo Bi-Flex Joint Shield Tabs Take by mouth.   predniSONE 20 MG tablet  Commonly known as: DELTASONE Take 2 tablets (40 mg total) by mouth daily with breakfast for 4 days. Start taking on: September 08, 2022   triamcinolone cream 0.1 % Commonly known as: KENALOG Apply 1 application topically as needed.   Xolair 150 MG injection Generic drug: omalizumab INJECT 300 MG SUBCUTANEOUSLY EVERY 4 WEEKS.       Past Medical History:  Diagnosis Date   Allergic rhinitis    Asthma    Hypertension    Osteoporosis    Past  Surgical History:  Procedure Laterality Date   APPENDECTOMY     TUBAL LIGATION     UTERINE FIBROID SURGERY     Otherwise, there have been no changes to her past medical history, surgical history, family history, or social history.  ROS: All others negative except as noted per HPI.   Objective:  BP (!) 140/66   Pulse 93   Temp 98.7 F (37.1 C) (Temporal)   Resp 18   Wt 160 lb 8 oz (72.8 kg)   SpO2 95%   BMI 31.35 kg/m  Body mass index is 31.35 kg/m. Physical Exam: General Appearance:  Alert, cooperative, no distress, appears stated age  Head:  Normocephalic, without obvious abnormality, atraumatic  Eyes:  Conjunctiva clear, EOM's intact  Nose: Nares normal, hypertrophic turbinates, normal mucosa, and no visible anterior polyps  Throat: Lips, tongue normal; teeth and gums normal, normal posterior oropharynx and + cobblestoning  Neck: Supple, symmetrical  Lungs:   wheezing throughout, Respirations unlabored, intermittent dry coughing  Heart:  regular rate and rhythm and no murmur, Appears well perfused  Extremities: No edema  Skin: Skin color, texture, turgor normal, no rashes or lesions on visualized portions of skin  Neurologic: No gross deficits   Reviewed: ACT 12/25  Spirometry:  Tracings reviewed. Her effort: Good reproducible efforts. FVC: 1.49L FEV1: 1.14L, 58% predicted FEV1/FVC ratio: 97% Interpretation:  normal ratio, Low FEV1, restriction present .  Please see scanned spirometry results for details.  Assessment/Plan   Asthma with exacerbation: FEV1 much lower than previous visit 40 mg IM depomedrol in clinic Tomorrow start prednisone 40 mg daily and continue for 4 days.  Continue Wixela 250-1 puff twice a day with a spacer to prevent cough or wheeze Continue albuterol 2 puffs every 4 hours as needed for cough or wheeze OR Instead use albuterol 0.083% solution via nebulizer one unit vial every 4 hours as needed for cough or wheeze You may use albuterol 2  puffs 5 to 15 minutes before activity to decrease cough or wheeze Continue Xolair 300 mg injection once every 28 days and have access to an epinephrine autoinjector set  Allergic rhinitis with acute sinusitis: - Start Augmentin 875 mg twice daily and take for 10 days, eat live cultured yogurt to help prevent yeast infection - I will send in fluconazole to be used if you develop a yeast infection - take 1 tablet and three days later take another tablet Call us if you are not improving in 48 hrs Continue avoidance measures directed toward mold as listed below Continue Flonase 2 sprays in each nostril once a day as needed for stuffy nose For thick postnasal drainage, begin Mucinex 600 to 1200 mg twice a day as needed Consider saline nasal rinses as needed for nasal symptoms. Use this before any medicated nasal sprays for best result  Reflux Continue dietary and lifestyle modifications as listed below Famotidine to 40 mg twice a day to control reflux Continue Tums 1-2 times a  day for reflux control  Call the clinic if this treatment plan is not working well for you  Follow up in 6 months or sooner if needed. It was a pleasure seeing you again in clinic today.  Sigurd Sos, MD  [facility]

## 2022-09-07 ENCOUNTER — Encounter: Payer: Self-pay | Admitting: Internal Medicine

## 2022-09-07 ENCOUNTER — Ambulatory Visit (INDEPENDENT_AMBULATORY_CARE_PROVIDER_SITE_OTHER): Payer: Medicare Other | Admitting: Internal Medicine

## 2022-09-07 VITALS — BP 140/66 | HR 93 | Temp 98.7°F | Resp 18 | Wt 160.5 lb

## 2022-09-07 DIAGNOSIS — K219 Gastro-esophageal reflux disease without esophagitis: Secondary | ICD-10-CM | POA: Diagnosis not present

## 2022-09-07 DIAGNOSIS — J019 Acute sinusitis, unspecified: Secondary | ICD-10-CM | POA: Diagnosis not present

## 2022-09-07 DIAGNOSIS — J4541 Moderate persistent asthma with (acute) exacerbation: Secondary | ICD-10-CM | POA: Diagnosis not present

## 2022-09-07 DIAGNOSIS — J3089 Other allergic rhinitis: Secondary | ICD-10-CM

## 2022-09-07 MED ORDER — METHYLPREDNISOLONE ACETATE 40 MG/ML IJ SUSP
40.0000 mg | Freq: Once | INTRAMUSCULAR | Status: AC
  Administered 2022-09-07: 40 mg via INTRAMUSCULAR

## 2022-09-07 MED ORDER — FLUTICASONE PROPIONATE 50 MCG/ACT NA SUSP: 1.0000 | Freq: Two times a day (BID) | NASAL | 5 refills | Status: DC

## 2022-09-07 MED ORDER — ALBUTEROL SULFATE HFA 108 (90 BASE) MCG/ACT IN AERS: 1.0000 | INHALATION_SPRAY | RESPIRATORY_TRACT | 1 refills | Status: DC | PRN

## 2022-09-07 MED ORDER — LORATADINE 10 MG PO TABS: 10.0000 mg | ORAL_TABLET | Freq: Every day | ORAL | 5 refills | Status: DC

## 2022-09-07 MED ORDER — FLUTICASONE-SALMETEROL 250-50 MCG/ACT IN AEPB: 1.0000 | INHALATION_SPRAY | Freq: Two times a day (BID) | RESPIRATORY_TRACT | 5 refills | Status: DC

## 2022-09-07 MED ORDER — AMOXICILLIN-POT CLAVULANATE 875-125 MG PO TABS: 1.0000 | ORAL_TABLET | Freq: Two times a day (BID) | ORAL | 0 refills | Status: AC

## 2022-09-07 MED ORDER — PREDNISONE 20 MG PO TABS: 40.0000 mg | ORAL_TABLET | Freq: Every day | ORAL | 0 refills | Status: AC

## 2022-09-07 MED ORDER — FAMOTIDINE 20 MG PO TABS: 20.0000 mg | ORAL_TABLET | Freq: Two times a day (BID) | ORAL | 5 refills | Status: DC

## 2022-09-07 MED ORDER — FLUCONAZOLE 150 MG PO TABS: ORAL_TABLET | ORAL | 0 refills | Status: DC

## 2022-09-07 MED ORDER — ALBUTEROL SULFATE (2.5 MG/3ML) 0.083% IN NEBU: INHALATION_SOLUTION | RESPIRATORY_TRACT | 3 refills | Status: DC

## 2022-09-07 NOTE — Patient Instructions (Addendum)
Asthma with exacerbation: 40 mg IM depomedrol in clinic Tomorrow start prednisone 40 mg daily and continue for 4 days.  Continue Wixela 250-1 puff twice a day with a spacer to prevent cough or wheeze Continue albuterol 2 puffs every 4 hours as needed for cough or wheeze OR Instead use albuterol 0.083% solution via nebulizer one unit vial every 4 hours as needed for cough or wheeze You may use albuterol 2 puffs 5 to 15 minutes before activity to decrease cough or wheeze Continue Xolair 300 mg injection once every 28 days and have access to an epinephrine autoinjector set  Allergic rhinitis with acute sinusitis: - Start Augmentin 875 mg twice daily and take for 10 days, eat live cultured yogurt to help prevent yeast infection - I will send in fluconazole to be used if you develop a yeast infection - take 1 tablet and three days later take another tablet Call us if you are not improving in 48 hrs Continue avoidance measures directed toward mold as listed below Continue Flonase 2 sprays in each nostril once a day as needed for stuffy nose For thick postnasal drainage, begin Mucinex 600 to 1200 mg twice a day as needed Consider saline nasal rinses as needed for nasal symptoms. Use this before any medicated nasal sprays for best result  Reflux Continue dietary and lifestyle modifications as listed below Famotidine to 40 mg twice a day to control reflux Continue Tums 1-2 times a day for reflux control  Call the clinic if this treatment plan is not working well for you  Follow up in 6 months or sooner if needed. It was a pleasure seeing you again in clinic today.  Sigurd Sos, MD [facility]   Control of Mold Allergen Mold and fungi can grow on a variety of surfaces provided certain temperature and moisture conditions exist.  Outdoor molds grow on plants, decaying vegetation and soil.  The major outdoor mold, Alternaria and Cladosporium, are found in very high numbers  during hot and dry conditions.  Generally, a late Summer - Fall peak is seen for common outdoor fungal spores.  Rain will temporarily lower outdoor mold spore count, but counts rise rapidly when the rainy period ends.  The most important indoor molds are Aspergillus and Penicillium.  Dark, humid and poorly ventilated basements are ideal sites for mold growth.  The next most common sites of mold growth are the bathroom and the kitchen.  Outdoor [employer] Use air conditioning and keep windows closed Avoid exposure to decaying vegetation. Avoid leaf raking. Avoid grain handling. Consider wearing a face mask if working in moldy areas.  Indoor Mold Control Maintain humidity below 50%. Clean washable surfaces with 5% bleach solution. Remove sources e.g. Contaminated carpets.    Lifestyle Changes for Controlling GERD When you have GERD, stomach acid feels as if it's backing up toward your mouth. Whether or not you take medication to control your GERD, your symptoms can often be improved with lifestyle changes.   Raise Your Head Reflux is more likely to strike when you're lying down flat, because stomach fluid can flow backward more easily. Raising the head of your bed 4-6 inches can help. To do this: Slide blocks or books under the legs at the head of your bed. Or, place a wedge under the mattress. Many foam stores can make a suitable wedge for you. The wedge should run from your waist to the top of your head. Don't just prop your head  on several pillows. This increases pressure on your stomach. It can make GERD worse.  Watch Your Eating Habits Certain foods may increase the acid in your stomach or relax the lower esophageal sphincter, making GERD more likely. It's best to avoid the following: Coffee, tea, and carbonated drinks (with and without caffeine) Fatty, fried, or spicy food Mint, chocolate, onions, and tomatoes Any other foods that seem to irritate your stomach or cause you  pain  Relieve the Pressure Eat smaller meals, even if you have to eat more often. Don't lie down right after you eat. Wait a few hours for your stomach to empty. Avoid tight belts and tight-fitting clothes. Lose excess weight.  Tobacco and Alcohol Avoid smoking tobacco and drinking alcohol. They can make GERD symptoms worse.

## 2022-09-26 ENCOUNTER — Ambulatory Visit (INDEPENDENT_AMBULATORY_CARE_PROVIDER_SITE_OTHER): Payer: Medicare Other

## 2022-09-26 DIAGNOSIS — J4540 Moderate persistent asthma, uncomplicated: Secondary | ICD-10-CM | POA: Diagnosis not present

## 2022-10-24 ENCOUNTER — Ambulatory Visit: Payer: BLUE CROSS/BLUE SHIELD

## 2022-10-27 ENCOUNTER — Ambulatory Visit (INDEPENDENT_AMBULATORY_CARE_PROVIDER_SITE_OTHER): Payer: Medicare Other

## 2022-10-27 DIAGNOSIS — J4540 Moderate persistent asthma, uncomplicated: Secondary | ICD-10-CM | POA: Diagnosis not present

## 2022-11-24 ENCOUNTER — Ambulatory Visit (INDEPENDENT_AMBULATORY_CARE_PROVIDER_SITE_OTHER): Payer: Medicare Other

## 2022-11-24 DIAGNOSIS — J4540 Moderate persistent asthma, uncomplicated: Secondary | ICD-10-CM | POA: Diagnosis not present

## 2022-12-22 ENCOUNTER — Ambulatory Visit (INDEPENDENT_AMBULATORY_CARE_PROVIDER_SITE_OTHER): Payer: Medicare Other

## 2022-12-22 DIAGNOSIS — J4540 Moderate persistent asthma, uncomplicated: Secondary | ICD-10-CM

## 2023-01-22 ENCOUNTER — Ambulatory Visit: Payer: Medicare Other

## 2023-01-22 DIAGNOSIS — J4540 Moderate persistent asthma, uncomplicated: Secondary | ICD-10-CM | POA: Diagnosis not present

## 2023-02-19 ENCOUNTER — Ambulatory Visit (INDEPENDENT_AMBULATORY_CARE_PROVIDER_SITE_OTHER): Payer: Medicare Other

## 2023-02-19 DIAGNOSIS — J4540 Moderate persistent asthma, uncomplicated: Secondary | ICD-10-CM

## 2023-03-12 ENCOUNTER — Ambulatory Visit: Payer: Medicare Other | Admitting: Internal Medicine

## 2023-03-15 ENCOUNTER — Ambulatory Visit (INDEPENDENT_AMBULATORY_CARE_PROVIDER_SITE_OTHER): Payer: Medicare Other | Admitting: Internal Medicine

## 2023-03-15 ENCOUNTER — Encounter: Payer: Self-pay | Admitting: Internal Medicine

## 2023-03-15 ENCOUNTER — Other Ambulatory Visit: Payer: Self-pay

## 2023-03-15 VITALS — BP 126/78 | HR 81 | Temp 97.7°F | Resp 20 | Ht 60.0 in | Wt 167.8 lb

## 2023-03-15 DIAGNOSIS — J4540 Moderate persistent asthma, uncomplicated: Secondary | ICD-10-CM | POA: Diagnosis not present

## 2023-03-15 DIAGNOSIS — J3089 Other allergic rhinitis: Secondary | ICD-10-CM

## 2023-03-15 DIAGNOSIS — K219 Gastro-esophageal reflux disease without esophagitis: Secondary | ICD-10-CM

## 2023-03-15 NOTE — Patient Instructions (Addendum)
Asthma:  Continue Wixela 250-1 puff twice a day with a spacer to prevent cough or wheeze Continue albuterol 2 puffs every 4 hours as needed for cough or wheeze OR Instead use albuterol 0.083% solution via nebulizer one unit vial every 4 hours as needed for cough or wheeze You may use albuterol 2 puffs 5 to 15 minutes before activity to decrease cough or wheeze Continue Xolair 300 mg injection once every 28 days and have access to an epinephrine autoinjector set  Allergic rhinitis  Continue avoidance measures directed toward mold as listed below Continue Flonase 2 sprays in each nostril once a day as needed for stuffy nose For thick postnasal drainage, begin Mucinex 600 to 1200 mg twice a day as needed Consider saline nasal rinses as needed for nasal symptoms. Use this before any medicated nasal sprays for best result  Reflux Continue dietary and lifestyle modifications as listed below Famotidine to 40 mg twice a day to control reflux Continue Tums 1-2 times a day for reflux control  Call the clinic if this treatment plan is not working well for you  Follow up in 6 months or sooner if needed. It was a pleasure seeing you again in clinic today.  Tonny Bollman, MD [facility]   Control of Mold Allergen Mold and fungi can grow on a variety of surfaces provided certain temperature and moisture conditions exist.  Outdoor molds grow on plants, decaying vegetation and soil.  The major outdoor mold, Alternaria and Cladosporium, are found in very high numbers during hot and dry conditions.  Generally, a late Summer - Fall peak is seen for common outdoor fungal spores.  Rain will temporarily lower outdoor mold spore count, but counts rise rapidly when the rainy period ends.  The most important indoor molds are Aspergillus and Penicillium.  Dark, humid and poorly ventilated basements are ideal sites for mold growth.  The next most common sites of mold growth are the bathroom and the  kitchen.  Outdoor [employer] Use air conditioning and keep windows closed Avoid exposure to decaying vegetation. Avoid leaf raking. Avoid grain handling. Consider wearing a face mask if working in moldy areas.  Indoor Mold Control Maintain humidity below 50%. Clean washable surfaces with 5% bleach solution. Remove sources e.g. Contaminated carpets.    Lifestyle Changes for Controlling GERD When you have GERD, stomach acid feels as if it's backing up toward your mouth. Whether or not you take medication to control your GERD, your symptoms can often be improved with lifestyle changes.   Raise Your Head Reflux is more likely to strike when you're lying down flat, because stomach fluid can flow backward more easily. Raising the head of your bed 4-6 inches can help. To do this: Slide blocks or books under the legs at the head of your bed. Or, place a wedge under the mattress. Many foam stores can make a suitable wedge for you. The wedge should run from your waist to the top of your head. Don't just prop your head on several pillows. This increases pressure on your stomach. It can make GERD worse.  Watch Your Eating Habits Certain foods may increase the acid in your stomach or relax the lower esophageal sphincter, making GERD more likely. It's best to avoid the following: Coffee, tea, and carbonated drinks (with and without caffeine) Fatty, fried, or spicy food Mint, chocolate, onions, and tomatoes Any other foods that seem to irritate your stomach or cause you pain  Relieve  the Pressure Eat smaller meals, even if you have to eat more often. Don't lie down right after you eat. Wait a few hours for your stomach to empty. Avoid tight belts and tight-fitting clothes. Lose excess weight.  Tobacco and Alcohol Avoid smoking tobacco and drinking alcohol. They can make GERD symptoms worse.

## 2023-03-15 NOTE — Progress Notes (Signed)
FOLLOW UP Date of Service/Encounter:  03/15/23  Subjective:  Susan Lyons (DOB: 12-18-1955) is a 67 y.o. female who [facility]23 in re-evaluation of the following:  asthma on Xolair, allergic rhinitis, and reflux.   History obtained from: chart review and patient.  For Review, LV was on 09/07/22  with Dr.Aadith Raudenbush seen for routine follow-up. See below for summary of history and diagnostics.   Therapeutic plans/changes recommended: she was having a flare at last visit and we gave her a steroid burst.  Her FEV1 was 58%. We also started augmentin x 10 days with fluconazole. Reflux was controlled  Today presents for follow-up. She has not been able to shop because of back and knee pain. She is on a muscle relaxer and nerve pain pill now. She has not used her rescue inhaler in 2-3 weeks and when she did it was due to overexerting herself. She has been on prednisone for her joint pain, but not for breathing.  She has not been on any antibiotics. She is planning to start getting injections for some of her joints. She is on her wixela every day without issues. She is doing well on Xolair, no side effects.  She is due next week. Her last injection was 02/19/23. She is taking famotidine once daily and is overall controlled on this.  She does occasionally need an afternoon Tums. She does use her flonase daily for the last 2 weeks. Fall is rough on her. Summer she didn't need it at all.   She will rarely but occasionally use mucinex.   Chart Review: Has lumbar spondylosis following with neurosurgery.  Most recent visit 03/01/2023  All medications reviewed by clinical staff and updated in chart. No new pertinent medical or surgical history except as noted in HPI.  ROS: All others negative except as noted per HPI.   Objective:  BP 126/78 (BP Location: Left Arm, Patient Position: Sitting, Cuff Size: Normal)   Pulse 81   Temp 97.7 F (36.5 C) (Temporal)    Resp 20   Ht 5' (1.524 m)   Wt 167 lb 12.8 oz (76.1 kg)   SpO2 97%   BMI 32.77 kg/m  Body mass index is 32.77 kg/m. Physical Exam: General Appearance:  Alert, cooperative, no distress, appears stated age  Head:  Normocephalic, without obvious abnormality, atraumatic  Eyes:  Conjunctiva clear, EOM's intact  Ears EACs normal bilaterally and normal TMs bilaterally  Nose: Nares normal, hypertrophic turbinates, normal mucosa, and no visible anterior polyps  Throat: Lips, tongue normal; teeth and gums normal, normal posterior oropharynx  Neck: Supple, symmetrical  Lungs:   clear to auscultation bilaterally, Respirations unlabored, no coughing  Heart:  regular rate and rhythm and no murmur, Appears well perfused  Extremities: No edema  Skin: Skin color, texture, turgor normal and no rashes or lesions on visualized portions of skin  Neurologic: No gross deficits   Labs:  Lab Orders  No laboratory test(s) ordered today    Spirometry:  Tracings reviewed. Her effort: Good reproducible efforts. FVC: 1.69L FEV1: 1.28L, 66% predicted FEV1/FVC ratio: 0.76 Interpretation: Nonobstructive ratio, low FEV1, possible restriction.  Please see scanned spirometry results for details.  Assessment/Plan   Asthma: at goal Continue Wixela 250-1 puff twice a day with a spacer to prevent cough or wheeze Continue albuterol 2 puffs every 4 hours as needed for cough or wheeze OR Instead use albuterol 0.083% solution via nebulizer one unit vial every 4 hours  as needed for cough or wheeze You may use albuterol 2 puffs 5 to 15 minutes before activity to decrease cough or wheeze Continue Xolair 300 mg injection once every 28 days and have access to an epinephrine autoinjector set  Allergic rhinitis - at goal Continue avoidance measures directed toward mold as listed below Continue Flonase 2 sprays in each nostril once a day as needed for stuffy nose For thick postnasal drainage, begin Mucinex 600 to 1200  mg twice a day as needed Consider saline nasal rinses as needed for nasal symptoms. Use this before any medicated nasal sprays for best result  Reflux - at goal Continue dietary and lifestyle modifications as listed below Famotidine to 40 mg twice a day to control reflux Continue Tums 1-2 times a day for reflux control  Call the clinic if this treatment plan is not working well for you  Follow up in 6 months or sooner if needed. It was a pleasure seeing you again in clinic today.  Other: none  Tonny Bollman, MD  [facility]

## 2023-03-19 ENCOUNTER — Ambulatory Visit: Payer: Medicare Other

## 2023-03-19 DIAGNOSIS — J4540 Moderate persistent asthma, uncomplicated: Secondary | ICD-10-CM | POA: Diagnosis not present

## 2023-03-19 NOTE — Addendum Note (Signed)
Addended by: Berna Bue on: 03/19/2023 08:40 AM   Modules accepted: Orders

## 2023-04-16 ENCOUNTER — Ambulatory Visit: Payer: Medicare Other

## 2023-04-16 DIAGNOSIS — J4540 Moderate persistent asthma, uncomplicated: Secondary | ICD-10-CM

## 2023-05-14 ENCOUNTER — Ambulatory Visit: Payer: BLUE CROSS/BLUE SHIELD

## 2023-05-15 ENCOUNTER — Ambulatory Visit: Payer: Medicare Other

## 2023-05-15 DIAGNOSIS — J4540 Moderate persistent asthma, uncomplicated: Secondary | ICD-10-CM | POA: Diagnosis not present

## 2023-06-13 ENCOUNTER — Ambulatory Visit (INDEPENDENT_AMBULATORY_CARE_PROVIDER_SITE_OTHER): Payer: Medicare Other

## 2023-06-13 DIAGNOSIS — J4540 Moderate persistent asthma, uncomplicated: Secondary | ICD-10-CM

## 2023-07-11 ENCOUNTER — Ambulatory Visit: Payer: Medicare Other

## 2023-07-11 DIAGNOSIS — J4540 Moderate persistent asthma, uncomplicated: Secondary | ICD-10-CM

## 2023-08-08 ENCOUNTER — Ambulatory Visit: Payer: Medicare Other

## 2023-08-08 DIAGNOSIS — J4540 Moderate persistent asthma, uncomplicated: Secondary | ICD-10-CM | POA: Diagnosis not present

## 2023-09-05 ENCOUNTER — Ambulatory Visit: Payer: Medicare Other

## 2023-09-05 DIAGNOSIS — J4540 Moderate persistent asthma, uncomplicated: Secondary | ICD-10-CM

## 2023-09-11 ENCOUNTER — Other Ambulatory Visit: Payer: Self-pay | Admitting: Internal Medicine

## 2023-09-11 DIAGNOSIS — J3089 Other allergic rhinitis: Secondary | ICD-10-CM

## 2023-09-20 ENCOUNTER — Ambulatory Visit: Payer: Medicare Other | Admitting: Internal Medicine

## 2023-09-26 ENCOUNTER — Ambulatory Visit (INDEPENDENT_AMBULATORY_CARE_PROVIDER_SITE_OTHER): Payer: Medicare Other | Admitting: Internal Medicine

## 2023-09-26 ENCOUNTER — Encounter: Payer: Self-pay | Admitting: Internal Medicine

## 2023-09-26 ENCOUNTER — Other Ambulatory Visit: Payer: Self-pay | Admitting: Internal Medicine

## 2023-09-26 VITALS — BP 110/68 | HR 87 | Temp 98.1°F | Ht 60.0 in

## 2023-09-26 DIAGNOSIS — J4540 Moderate persistent asthma, uncomplicated: Secondary | ICD-10-CM

## 2023-09-26 DIAGNOSIS — J3089 Other allergic rhinitis: Secondary | ICD-10-CM | POA: Diagnosis not present

## 2023-09-26 DIAGNOSIS — K219 Gastro-esophageal reflux disease without esophagitis: Secondary | ICD-10-CM | POA: Diagnosis not present

## 2023-09-26 MED ORDER — ALBUTEROL SULFATE (2.5 MG/3ML) 0.083% IN NEBU: INHALATION_SOLUTION | RESPIRATORY_TRACT | 3 refills | Status: DC

## 2023-09-26 MED ORDER — FLUTICASONE PROPIONATE 50 MCG/ACT NA SUSP
1.0000 | Freq: Two times a day (BID) | NASAL | 5 refills | Status: DC
Start: 2023-09-26 — End: 2024-03-28

## 2023-09-26 MED ORDER — FLUTICASONE-SALMETEROL 500-50 MCG/ACT IN AEPB: 1.0000 | INHALATION_SPRAY | Freq: Two times a day (BID) | RESPIRATORY_TRACT | 5 refills | Status: DC

## 2023-09-26 MED ORDER — EPINEPHRINE 0.3 MG/0.3ML IJ SOAJ: INTRAMUSCULAR | 2 refills | Status: AC

## 2023-09-26 MED ORDER — LORATADINE 10 MG PO TABS: 10.0000 mg | ORAL_TABLET | Freq: Every day | ORAL | 5 refills | Status: AC

## 2023-09-26 NOTE — Patient Instructions (Addendum)
 Asthma:  Increase to Wixela 500-1 puff twice a day to prevent cough or wheeze Continue albuterol 2 puffs every 4 hours as needed for cough or wheeze OR Instead use albuterol 0.083% solution via nebulizer one unit vial every 4 hours as needed for cough or wheeze You may use albuterol 2 puffs 5 to 15 minutes before activity to decrease cough or wheeze Continue Xolair 300 mg injection once every 28 days and have access to an epinephrine autoinjector set  Allergic rhinitis  Continue avoidance measures directed toward mold as listed below Continue Claritin 10 mg daily as needed Use saline spray prior to medicated nasal sprays. Continue Flonase 2 sprays in each nostril once a day as needed for stuffy nose For thick postnasal drainage, begin Mucinex 600 to 1200 mg twice a day as needed Consider saline nasal rinses as needed for nasal symptoms. Use this before any medicated nasal sprays for best result  Reflux Continue dietary and lifestyle modifications as listed below Famotidine 40 mg once a day to control reflux-can increase to twice daily if needed during flares. Continue Tums 1-2 times a day for reflux control  Call the clinic if this treatment plan is not working well for you  Follow up in 6 months or sooner if needed. It was a pleasure seeing you again in clinic today.  Tonny Bollman, MD [facility]   Control of Mold Allergen Mold and fungi can grow on a variety of surfaces provided certain temperature and moisture conditions exist.  Outdoor molds grow on plants, decaying vegetation and soil.  The major outdoor mold, Alternaria and Cladosporium, are found in very high numbers during hot and dry conditions.  Generally, a late Summer - Fall peak is seen for common outdoor fungal spores.  Rain will temporarily lower outdoor mold spore count, but counts rise rapidly when the rainy period ends.  The most important indoor molds are Aspergillus and Penicillium.  Dark, humid and  poorly ventilated basements are ideal sites for mold growth.  The next most common sites of mold growth are the bathroom and the kitchen.  Outdoor [employer] Use air conditioning and keep windows closed Avoid exposure to decaying vegetation. Avoid leaf raking. Avoid grain handling. Consider wearing a face mask if working in moldy areas.  Indoor Mold Control Maintain humidity below 50%. Clean washable surfaces with 5% bleach solution. Remove sources e.g. Contaminated carpets.    Lifestyle Changes for Controlling GERD When you have GERD, stomach acid feels as if it's backing up toward your mouth. Whether or not you take medication to control your GERD, your symptoms can often be improved with lifestyle changes.   Raise Your Head Reflux is more likely to strike when you're lying down flat, because stomach fluid can flow backward more easily. Raising the head of your bed 4-6 inches can help. To do this: Slide blocks or books under the legs at the head of your bed. Or, place a wedge under the mattress. Many foam stores can make a suitable wedge for you. The wedge should run from your waist to the top of your head. Don't just prop your head on several pillows. This increases pressure on your stomach. It can make GERD worse.  Watch Your Eating Habits Certain foods may increase the acid in your stomach or relax the lower esophageal sphincter, making GERD more likely. It's best to avoid the following: Coffee, tea, and carbonated drinks (with and without caffeine) Fatty, fried, or spicy food  Mint, chocolate, onions, and tomatoes Any other foods that seem to irritate your stomach or cause you pain  Relieve the Pressure Eat smaller meals, even if you have to eat more often. Don't lie down right after you eat. Wait a few hours for your stomach to empty. Avoid tight belts and tight-fitting clothes. Lose excess weight.  Tobacco and Alcohol Avoid smoking tobacco and drinking alcohol.  They can make GERD symptoms worse.

## 2023-09-26 NOTE — Progress Notes (Signed)
 FOLLOW UP Date of Service/Encounter:  09/26/23  Subjective:  Susan Lyons (DOB: 1955-09-14) is a 68 y.o. female who [facility] in re-evaluation of the following: asthma on Xolair, allergic rhinitis, and reflux.   History obtained from: chart review and patient.  For Review, LV was on 03/15/23  with Dr.Hridhaan Yohn seen for routine follow-up. Her asthma was well-controlled on Xolair at that visit although she was having some issues with her joints and receiving prednisone for that. Her reflux was controlled with daily famotidine and occasional Tums. V1 was 66%.  She was continued on Wixela 250 mcg 1 puff twice daily along with her Xolair 300 mg injections every 28 days. Allergies controlled with Flonase and Mucinex as needed. Has tried and failed azelastine.  -------------------------------------------------------- Today presents for follow-up. Discussed the use of AI scribe software for clinical note transcription with the patient, who gave verbal consent to proceed.  History of Present Illness   Susan Lyons is a 68 year old female with asthma who presents with recent asthma exacerbation and reflux issues.  Two nights ago, she experienced an asthma exacerbation, waking up at 1 AM with difficulty breathing and a sensation of choking. This episode was unusual as she has not had such an attack in several years. She recalls similar episodes when she was on Singulair, which she has not used in a long time. She currently uses albuterol as needed, with the last use during the recent episode. She has not been on prednisone this year but receives cortisone shots in her spine every three months.  She has been dealing with reflux issues, which she believes were exacerbated by eating spicy salsa late at night. This was the night of her "asthma attack".  She takes famotidine in the morning and occasionally at night, supplemented with Tums if needed. She is  unsure which dose of famotidine she takes. She has experienced oral thrush recently, which she managed with daily [employer] yogurt. She has not been on PPIs recently due to concerns about side effects.  She has been experiencing increased allergy symptoms from fall to now, requiring daily Claritin and Flonase. She requested a refill for Flonase. She also reports nasal congestion, particularly on the left side, which sometimes leads to vertigo. She uses saline mist for relief when needed.  She mentions a history of vertigo related to nasal congestion and has been managing it for about a year. She uses saline mist to alleviate symptoms when her nose is stuffy.      Chart Review: Last Xolair injection on 09/05/23.  All medications reviewed by clinical staff and updated in chart. No new pertinent medical or surgical history except as noted in HPI.  ROS: All others negative except as noted per HPI.   Objective:  BP 110/68   Pulse 87   Temp 98.1 F (36.7 C) (Temporal)   Ht 5' (1.524 m)   SpO2 96%   BMI 32.77 kg/m  Body mass index is 32.77 kg/m. Physical Exam: General Appearance:  Alert, cooperative, no distress, appears stated age  Head:  Normocephalic, without obvious abnormality, atraumatic  Eyes:  Conjunctiva clear, EOM's intact  Ears EACs normal bilaterally and normal TMs bilaterally  Nose: Nares normal, hypertrophic turbinates, normal mucosa, no visible anterior polyps, and septum midline  Throat: Lips, tongue normal; teeth and gums normal, normal posterior oropharynx  Neck: Supple, symmetrical  Lungs:   Few scattered wheezes on end exhalation , Respirations unlabored,  no coughing  Heart:  regular rate and rhythm and no murmur, Appears well perfused  Extremities: No edema  Skin: Skin color, texture, turgor normal and no rashes or lesions on visualized portions of skin  Neurologic: No gross deficits   Labs:  Lab Orders  No laboratory test(s) ordered today    Spirometry:   Tracings reviewed. Her effort: Good reproducible efforts. FVC: 1.69L FEV1: 1.35L, 70% predicted FEV1/FVC ratio: 101% Interpretation: Nonobstructive ratio, low FEV1.  Possible restriction-stable from prior. Please see scanned spirometry results for details.  Assessment/Plan   Asthma: - not at goal Increase to Wixela 500-1 puff twice a day to prevent cough or wheeze Continue albuterol 2 puffs every 4 hours as needed for cough or wheeze OR Instead use albuterol 0.083% solution via nebulizer one unit vial every 4 hours as needed for cough or wheeze You may use albuterol 2 puffs 5 to 15 minutes before activity to decrease cough or wheeze Continue Xolair 300 mg injection once every 28 days and have access to an epinephrine autoinjector set  Allergic rhinitis  - at goal Continue avoidance measures directed toward mold as listed below Continue Claritin 10 mg daily as needed Use saline spray prior to medicated nasal sprays. Continue Flonase 2 sprays in each nostril once a day as needed for stuffy nose For thick postnasal drainage, begin Mucinex 600 to 1200 mg twice a day as needed Consider saline nasal rinses as needed for nasal symptoms. Use this before any medicated nasal sprays for best result  Reflux- not at goal Continue dietary and lifestyle modifications as listed below Famotidine 40 mg once a day to control reflux-can increase to twice daily if needed during flares. Continue Tums 1-2 times a day for reflux control  Call the clinic if this treatment plan is not working well for you  Follow up in 6 months or sooner if needed. It was a pleasure seeing you again in clinic today.  Other: none  Tonny Bollman, MD  [facility]

## 2023-10-03 ENCOUNTER — Ambulatory Visit

## 2023-10-03 DIAGNOSIS — J4540 Moderate persistent asthma, uncomplicated: Secondary | ICD-10-CM | POA: Diagnosis not present

## 2023-10-31 ENCOUNTER — Ambulatory Visit

## 2023-11-01 ENCOUNTER — Ambulatory Visit

## 2023-11-01 DIAGNOSIS — J4540 Moderate persistent asthma, uncomplicated: Secondary | ICD-10-CM | POA: Diagnosis not present

## 2023-11-29 ENCOUNTER — Ambulatory Visit (INDEPENDENT_AMBULATORY_CARE_PROVIDER_SITE_OTHER)

## 2023-11-29 DIAGNOSIS — J4540 Moderate persistent asthma, uncomplicated: Secondary | ICD-10-CM | POA: Diagnosis not present

## 2023-12-27 ENCOUNTER — Ambulatory Visit (INDEPENDENT_AMBULATORY_CARE_PROVIDER_SITE_OTHER)

## 2023-12-27 DIAGNOSIS — J4540 Moderate persistent asthma, uncomplicated: Secondary | ICD-10-CM

## 2024-01-24 ENCOUNTER — Ambulatory Visit

## 2024-01-24 DIAGNOSIS — J4540 Moderate persistent asthma, uncomplicated: Secondary | ICD-10-CM

## 2024-02-21 ENCOUNTER — Ambulatory Visit

## 2024-02-21 DIAGNOSIS — J4540 Moderate persistent asthma, uncomplicated: Secondary | ICD-10-CM | POA: Diagnosis not present

## 2024-03-20 ENCOUNTER — Ambulatory Visit (INDEPENDENT_AMBULATORY_CARE_PROVIDER_SITE_OTHER)

## 2024-03-20 DIAGNOSIS — J4540 Moderate persistent asthma, uncomplicated: Secondary | ICD-10-CM | POA: Diagnosis not present

## 2024-03-28 ENCOUNTER — Encounter: Payer: Self-pay | Admitting: Internal Medicine

## 2024-03-28 ENCOUNTER — Ambulatory Visit: Admitting: Internal Medicine

## 2024-03-28 ENCOUNTER — Other Ambulatory Visit: Payer: Self-pay

## 2024-03-28 VITALS — BP 128/88 | HR 81 | Temp 98.4°F | Resp 20 | Ht 60.0 in | Wt 168.9 lb

## 2024-03-28 DIAGNOSIS — J4540 Moderate persistent asthma, uncomplicated: Secondary | ICD-10-CM

## 2024-03-28 DIAGNOSIS — J3089 Other allergic rhinitis: Secondary | ICD-10-CM

## 2024-03-28 DIAGNOSIS — K219 Gastro-esophageal reflux disease without esophagitis: Secondary | ICD-10-CM

## 2024-03-28 MED ORDER — ALBUTEROL SULFATE (2.5 MG/3ML) 0.083% IN NEBU: INHALATION_SOLUTION | RESPIRATORY_TRACT | 3 refills | Status: AC

## 2024-03-28 MED ORDER — FLUTICASONE PROPIONATE 50 MCG/ACT NA SUSP: 1.0000 | Freq: Two times a day (BID) | NASAL | 5 refills | Status: AC

## 2024-03-28 MED ORDER — ALBUTEROL SULFATE HFA 108 (90 BASE) MCG/ACT IN AERS: 1.0000 | INHALATION_SPRAY | RESPIRATORY_TRACT | 1 refills | Status: AC | PRN

## 2024-03-28 MED ORDER — FAMOTIDINE 20 MG PO TABS: 20.0000 mg | ORAL_TABLET | Freq: Two times a day (BID) | ORAL | 5 refills | Status: AC

## 2024-03-28 MED ORDER — FLUTICASONE-SALMETEROL 500-50 MCG/ACT IN AEPB: 1.0000 | INHALATION_SPRAY | Freq: Two times a day (BID) | RESPIRATORY_TRACT | 5 refills | Status: AC

## 2024-03-28 NOTE — Patient Instructions (Addendum)
 Asthma:  Increase to Wixela 500-1 puff twice a day to prevent cough or wheeze Continue albuterol  2 puffs every 4 hours as needed for cough or wheeze OR Instead use albuterol  0.083% solution via nebulizer one unit vial every 4 hours as needed for cough or wheeze You may use albuterol  2 puffs 5 to 15 minutes before activity to decrease cough or wheeze Continue Xolair  300 mg injection once every 28 days and have access to an epinephrine  autoinjector set  Allergic rhinitis  Continue avoidance measures directed toward mold as listed below Continue Claritin  10 mg daily as needed Use saline spray prior to medicated nasal sprays. Continue Flonase  2 sprays in each nostril once a day as needed for stuffy nose For thick postnasal drainage, begin Mucinex 600 to 1200 mg twice a day as needed Consider saline nasal rinses as needed for nasal symptoms. Use this before any medicated nasal sprays for best result  Reflux Continue dietary and lifestyle modifications as listed below Famotidine  20 mg twice a day to control reflux-can increase to twice daily if needed during flares. Continue Tums 1-2 times a day for reflux control  Call the clinic if this treatment plan is not working well for you  Follow up in 6 months or sooner if needed. It was a pleasure seeing you again in clinic today.  Rocky Endow, MD [facility]   Control of Mold Allergen Mold and fungi can grow on a variety of surfaces provided certain temperature and moisture conditions exist.  Outdoor molds grow on plants, decaying vegetation and soil.  The major outdoor mold, Alternaria and Cladosporium, are found in very high numbers during hot and dry conditions.  Generally, a late Summer - Fall peak is seen for common outdoor fungal spores.  Rain will temporarily lower outdoor mold spore count, but counts rise rapidly when the rainy period ends.  The most important indoor molds are Aspergillus and Penicillium.  Dark, humid  and poorly ventilated basements are ideal sites for mold growth.  The next most common sites of mold growth are the bathroom and the kitchen.  Outdoor [employer] Use air conditioning and keep windows closed Avoid exposure to decaying vegetation. Avoid leaf raking. Avoid grain handling. Consider wearing a face mask if working in moldy areas.  Indoor Mold Control Maintain humidity below 50%. Clean washable surfaces with 5% bleach solution. Remove sources e.g. Contaminated carpets.    Lifestyle Changes for Controlling GERD When you have GERD, stomach acid feels as if it's backing up toward your mouth. Whether or not you take medication to control your GERD, your symptoms can often be improved with lifestyle changes.   Raise Your Head Reflux is more likely to strike when you're lying down flat, because stomach fluid can flow backward more easily. Raising the head of your bed 4-6 inches can help. To do this: Slide blocks or books under the legs at the head of your bed. Or, place a wedge under the mattress. Many foam stores can make a suitable wedge for you. The wedge should run from your waist to the top of your head. Don't just prop your head on several pillows. This increases pressure on your stomach. It can make GERD worse.  Watch Your Eating Habits Certain foods may increase the acid in your stomach or relax the lower esophageal sphincter, making GERD more likely. It's best to avoid the following: Coffee, tea, and carbonated drinks (with and without caffeine) Fatty, fried, or spicy food  Mint, chocolate, onions, and tomatoes Any other foods that seem to irritate your stomach or cause you pain  Relieve the Pressure Eat smaller meals, even if you have to eat more often. Don't lie down right after you eat. Wait a few hours for your stomach to empty. Avoid tight belts and tight-fitting clothes. Lose excess weight.  Tobacco and Alcohol Avoid smoking tobacco and drinking  alcohol. They can make GERD symptoms worse.

## 2024-03-28 NOTE — Progress Notes (Signed)
 FOLLOW UP Date of Service/Encounter:   03/28/2024  Subjective:  Susan Lyons (DOB: 1955-08-23) is a 68 y.o. female who [facility] in re-evaluation of the following: asthma on Xolair , allergic rhinitis, and reflux.   History obtained from: chart review and patient.  For Review, LV was on 09/26/23  with Dr.Brynlei Klausner seen for routine follow-up. See below for summary of history and diagnostics.   Therapeutic plans/changes recommended: FEV1 70%.  Asthma was not well-controlled at that visit and we increased her Wixela to 500, 1 puff twice daily and continued her Xolair . ----------------------------------------------------- Pertinent History/Diagnostics:  Asthma: On Wixela 500, 1 puff twice daily and Xolair  300 mg every 4 weeks Chronic Rhinitis:  Managed with Zyrtec and Flonase . Other: Reflux managed with famotidine  --------------------------------------------------- Today presents for follow-up. Discussed the use of AI scribe software for clinical note transcription with the patient, who gave verbal consent to proceed.  History of Present Illness Susan Lyons is a 68 year old female with a history of pneumonia who presents for follow-up of her respiratory symptoms.  Respiratory symptoms - Diagnosed with pneumonia two weeks ago and treated with high-dose antibiotics-Augmentin  and doxycycline - Significant improvement in overall symptoms since treatment - Persistent frequent cough, especially triggered by laughing - Current use of rescue inhaler once daily, decreased from every six hours during acute illness - Rarely required rescue inhaler prior to pneumonia - Continues Wixela powder inhaler twice daily - Receives Xolair  injections and tolerating without adverse events  Upper airway symptoms - Experiences occasional nasal congestion - Congestion managed with daily Flonase  nasal spray and over-the-counter Claritin   Gastrointestinal  symptoms - Gastroesophageal reflux well-controlled with Pepcid , taken twice daily as needed, especially after spicy foods - History of bubbling sensations in the esophagus, present for several years and previously bothersome at night - Bubbling sensation improved with recent antibiotic therapy -Has a history of having esophageal stretch. - Has upcoming appointment with GI in October or November.     Chart Review: Last Xolair  injection given 03/20/2024. Diagnosed with community-acquired pneumonia 03/05/2024 in urgent care. CXR: CONCLUSION: Question bilateral lower lob hazy infiltrates  Prescribed Augmentin  plus doxycycline plus Medrol  Dosepak.  All medications reviewed by clinical staff and updated in chart. No new pertinent medical or surgical history except as noted in HPI.  ROS: All others negative except as noted per HPI.   Objective:  BP 128/88   Pulse 81   Temp 98.4 F (36.9 C) (Temporal)   Resp 20   Ht 5' (1.524 m)   Wt 168 lb 14.4 oz (76.6 kg)   SpO2 96%   BMI 32.99 kg/m  Body mass index is 32.99 kg/m. Physical Exam: General Appearance:  Alert, cooperative, no distress, appears stated age  Head:  Normocephalic, without obvious abnormality, atraumatic  Eyes:  Conjunctiva clear, EOM's intact  Ears EACs normal bilaterally and normal TMs bilaterally  Nose: Nares normal, normal mucosa and no visible anterior polyps  Throat: Lips, tongue normal; teeth and gums normal, normal posterior oropharynx  Neck: Supple, symmetrical  Lungs:   clear to auscultation bilaterally, Respirations unlabored, intermittent dry coughing  Heart:  regular rate and rhythm and no murmur, Appears well perfused  Extremities: No edema  Skin: Skin color, texture, turgor normal and no rashes or lesions on visualized portions of skin  Neurologic: No gross deficits   Labs:  Lab Orders  No laboratory test(s) ordered today    Spirometry:  Tracings reviewed. Her effort:  Good reproducible  efforts. FVC: 1.75L FEV1: 1.36L, 70% predicted FEV1/FVC ratio: 0.78 Interpretation: Nonobstructive ratio, low FEV1, possible restriction.  Similar to prior. Please see scanned spirometry results for details.  Assessment/Plan   Reports recovering well from recent pneumonia.  Lungs sound clear on exam.  Still coughing,  Asthma: At goal Increase to Wixela 500-1 puff twice a day to prevent cough or wheeze Continue albuterol  2 puffs every 4 hours as needed for cough or wheeze OR Instead use albuterol  0.083% solution via nebulizer one unit vial every 4 hours as needed for cough or wheeze You may use albuterol  2 puffs 5 to 15 minutes before activity to decrease cough or wheeze Continue Xolair  300 mg injection once every 28 days and have access to an epinephrine  autoinjector set  Allergic rhinitis-at goal Continue avoidance measures directed toward mold as listed below Continue Claritin  10 mg daily as needed Use saline spray prior to medicated nasal sprays. Continue Flonase  2 sprays in each nostril once a day as needed for stuffy nose For thick postnasal drainage, begin Mucinex 600 to 1200 mg twice a day as needed Consider saline nasal rinses as needed for nasal symptoms. Use this before any medicated nasal sprays for best result  Reflux-at goal Continue dietary and lifestyle modifications as listed below Famotidine  20 mg twice a day to control reflux-can increase to twice daily if needed during flares. Continue Tums 1-2 times a day for reflux control  Call the clinic if this treatment plan is not working well for you  Follow up in 6 months or sooner if needed. It was a pleasure seeing you again in clinic today.  Other: none  Rocky Endow, MD  [facility] 

## 2024-04-17 ENCOUNTER — Ambulatory Visit

## 2024-04-17 DIAGNOSIS — J4540 Moderate persistent asthma, uncomplicated: Secondary | ICD-10-CM

## 2024-05-15 ENCOUNTER — Ambulatory Visit

## 2024-05-19 ENCOUNTER — Ambulatory Visit (INDEPENDENT_AMBULATORY_CARE_PROVIDER_SITE_OTHER)

## 2024-05-19 DIAGNOSIS — J4540 Moderate persistent asthma, uncomplicated: Secondary | ICD-10-CM

## 2024-06-16 ENCOUNTER — Ambulatory Visit

## 2024-06-16 DIAGNOSIS — J4540 Moderate persistent asthma, uncomplicated: Secondary | ICD-10-CM

## 2024-07-14 ENCOUNTER — Ambulatory Visit

## 2024-07-14 DIAGNOSIS — J4540 Moderate persistent asthma, uncomplicated: Secondary | ICD-10-CM

## 2024-07-14 MED ORDER — OMALIZUMAB 300 MG/2  ML ~~LOC~~ SOSY
300.0000 mg | PREFILLED_SYRINGE | Freq: Once | SUBCUTANEOUS | Status: DC
  Administered 2024-07-14: 300 mg via SUBCUTANEOUS

## 2024-08-11 ENCOUNTER — Ambulatory Visit

## 2024-09-25 ENCOUNTER — Ambulatory Visit: Admitting: Internal Medicine
